# Patient Record
Sex: Female | Born: 2000 | Race: Black or African American | Hispanic: No | Marital: Single | Smoking: Never smoker
Health system: Southern US, Community
[De-identification: ages and names within clinical notes are randomized; demographics above are authoritative.]

## PROBLEM LIST (undated history)

## (undated) DIAGNOSIS — E669 Obesity, unspecified: Secondary | ICD-10-CM

## (undated) DIAGNOSIS — D649 Anemia, unspecified: Secondary | ICD-10-CM

## (undated) DIAGNOSIS — F419 Anxiety disorder, unspecified: Secondary | ICD-10-CM

## (undated) DIAGNOSIS — F32A Depression, unspecified: Secondary | ICD-10-CM

## (undated) DIAGNOSIS — J45909 Unspecified asthma, uncomplicated: Secondary | ICD-10-CM

## (undated) HISTORY — DX: Anxiety disorder, unspecified: F41.9

## (undated) HISTORY — DX: Depression, unspecified: F32.A

## (undated) HISTORY — DX: Obesity, unspecified: E66.9

## (undated) HISTORY — DX: Anemia, unspecified: D64.9

---
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
## 2022-01-04 NOTE — Progress Notes (Signed)
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 Discussed changing to Symbicort and use prior to going to work   Vitamin D deficiency: not currently taking supplements  GAD/MDD: she states she has been depressed since around age 55, during her teen years she had suicidal thoughts and twice she took extra pills trying to commit suicide. She has tried wellbutrin and Celexa but first medication she is not sure why she stopped and celexa made her feel numb but still depressed. She has seen a psychiatrist in the past and has been admitted to a psychiatric facility once. She graduated HS in 2023, currently working part time at [employer]. She has a supportive family . She denies suicidal thoughts or ideation. Phq 9 is very high and is willing to establish with another psychiatrist. We discussed medications since it will take weeks before she can see a psychiatrist. Mother was with her during the visit and will monitor for increase in suicidal thoughts and bring her sooner for a follow up, otherwise one month visit will be scheduled   Obesity: she has been obese all her life. She was diagnosed with pre diabetes an high prolactin in the past. She states since she has a physical job at [employer] she has lost weight . We will check labs. Discussed considering metformin    Patient Active Problem List   Diagnosis Date Noted   Morbid obesity with BMI of 40.0-44.9, adult (HCC) 01/05/2022   Vitamin D deficiency 01/05/2022   Elevated prolactin level 01/05/2022   Asthma, mild intermittent, poorly controlled 01/05/2022   Generalized anxiety disorder 04/16/2019   Moderate episode of recurrent major depressive disorder (HCC) 04/16/2019    Past  Surgical History:  Procedure Laterality Date   TONSILLECTOMY AND ADENOIDECTOMY  2009    Family History  Problem Relation Age of Onset   Hypertension Mother     Social History   Tobacco Use   Smoking status: Never   Smokeless tobacco: Never  Substance Use Topics   Alcohol use: Never     Current Outpatient Medications:    budesonide-formoterol (SYMBICORT) 160-4.5 MCG/ACT inhaler, Inhale 2 puffs into the lungs 2 (two) times daily., Disp: 1 each, Rfl: 2   DULoxetine (CYMBALTA) 30 MG capsule, Take 1-2 capsules (30-60 mg total) by mouth daily., Disp: 60 capsule, Rfl: 0   Vitamin D, Ergocalciferol, (DRISDOL) 1.25 MG (50000 UNIT) CAPS capsule, Take 1 capsule (50,000 Units total) by mouth every 7 (seven) days. Please take every Thursday starting 01/17/2019, Disp: 12 capsule, Rfl: 1  No Known Allergies  I personally reviewed active problem list, medication list, allergies, family history, social history, health maintenance with the patient/caregiver today.   ROS  Constitutional: Negative for fever or weight change.  Respiratory: Negative for cough and shortness of breath.   Cardiovascular: Negative for chest pain or palpitations.  Gastrointestinal: Negative for abdominal pain, no bowel changes.  Musculoskeletal: Negative for gait problem or joint swelling.  Skin: Negative for rash.  Neurological: Negative for dizziness or headache.  No other specific complaints in a complete review of systems (except as listed in HPI above).   Objective  Vitals:   01/05/22 1130  BP: 122/84  Pulse: 90  Resp: 16  SpO2: 97%  Weight: (!) 312 lb (141.5 kg)  Height: 5\' 10"  (1.778 m)    Body mass index is 44.77 kg/m.  Physical Exam  Constitutional: Patient appears well-developed and well-nourished. Obese  No distress.  HEENT: head atraumatic, normocephalic, pupils equal and reactive to light, neck supple, throat within normal limits Cardiovascular: Normal rate, regular rhythm and normal  heart sounds.  No murmur heard. No BLE edema. Pulmonary/Chest: Effort normal and breath sounds normal. No respiratory distress. Abdominal: Soft.  There is no tenderness. Psychiatric: Patient has a normal mood and affect. behavior is normal. Judgment and thought content normal.   PHQ2/9:    01/05/2022   11:30 AM  Depression screen PHQ 2/9  Decreased Interest 3  Down, Depressed, Hopeless 3  PHQ - 2 Score 6  Altered sleeping 3  Tired, decreased energy 1  Change in appetite 3  Feeling bad or failure about yourself  3  Trouble concentrating 3  Moving slowly or fidgety/restless 0  Suicidal thoughts 0  PHQ-9 Score 19    phq 9 is positive   Fall Risk:    01/05/2022   11:29 AM  Fall Risk   Falls in the past year? 0  Number falls in past yr: 0  Injury with Fall? 0  Risk for fall due to : No Fall Risks  Follow up Falls prevention discussed      Functional Status Survey: Is the patient deaf or have difficulty hearing?: No Does the patient have difficulty seeing, even when wearing glasses/contacts?: No Does the patient have difficulty concentrating, remembering, or making decisions?: Yes Does the patient have difficulty walking or climbing stairs?: No Does the patient have difficulty dressing or bathing?: No Does the patient have difficulty doing errands alone such as visiting a doctor's office or shopping?: No    Assessment & Plan   1. Encounter to establish care  - CBC with Differential/Platelet - Lipid Profile - COMPLETE METABOLIC PANEL WITH GFR - [facility] 01/07/2022  2. Moderate episode of recurrent major depressive disorder (HCC)  - Ambulatory referral to Psychiatry - DULoxetine (CYMBALTA) 30 MG capsule; Take 1-2 capsules (30-60 mg total) by mouth daily.  Dispense: 60 capsule; Refill: 0  3. Screen for STD (sexually transmitted disease)  - HIV antibody (with reflex) - Cervicovaginal ancillary only - RPR  4. Morbid obesity with BMI of 40.0-44.9, adult (HCC)  -  TSH  5. Need for hepatitis C screening test  - Hepatitis C Antibody  6. Need for immunization against influenza  - Flu Vaccine QUAD 6+ mos PF IM (Fluarix Quad PF)  7. Vitamin D deficiency  - Vitamin D, Ergocalciferol, (DRISDOL) 1.25 MG (50000 UNIT) CAPS capsule; Take 1 capsule (50,000 Units total) by mouth every 7 (seven) days. Please take every Thursday starting 01/17/2019  Dispense: 12 capsule; Refill: 1  8. Elevated prolactin level  - Prolactin

## 2022-01-05 ENCOUNTER — Other Ambulatory Visit (HOSPITAL_COMMUNITY)
Admission: RE | Admit: 2022-01-05 | Discharge: 2022-01-05 | Disposition: A | Discharge status: Home / Self Care | Payer: BC Managed Care – PPO | Source: Ambulatory Visit | Attending: Family Medicine | Admitting: Family Medicine

## 2022-01-05 ENCOUNTER — Ambulatory Visit: Payer: BC Managed Care – PPO | Admitting: Family Medicine

## 2022-01-05 ENCOUNTER — Encounter: Payer: Self-pay | Admitting: Family Medicine

## 2022-01-05 VITALS — BP 122/84 | HR 90 | Resp 16 | Ht 70.0 in | Wt 312.0 lb

## 2022-01-05 DIAGNOSIS — F331 Major depressive disorder, recurrent, moderate: Secondary | ICD-10-CM | POA: Diagnosis not present

## 2022-01-05 DIAGNOSIS — Z7689 Persons encountering health services in other specified circumstances: Secondary | ICD-10-CM

## 2022-01-05 DIAGNOSIS — E559 Vitamin D deficiency, unspecified: Secondary | ICD-10-CM

## 2022-01-05 DIAGNOSIS — Z113 Encounter for screening for infections with a predominantly sexual mode of transmission: Secondary | ICD-10-CM | POA: Diagnosis present

## 2022-01-05 DIAGNOSIS — Z23 Encounter for immunization: Secondary | ICD-10-CM

## 2022-01-05 DIAGNOSIS — R7989 Other specified abnormal findings of blood chemistry: Secondary | ICD-10-CM | POA: Insufficient documentation

## 2022-01-05 DIAGNOSIS — Z1159 Encounter for screening for other viral diseases: Secondary | ICD-10-CM

## 2022-01-05 DIAGNOSIS — J4520 Mild intermittent asthma, uncomplicated: Secondary | ICD-10-CM

## 2022-01-05 DIAGNOSIS — Z6841 Body Mass Index (BMI) 40.0 and over, adult: Secondary | ICD-10-CM

## 2022-01-05 MED ORDER — VITAMIN D (ERGOCALCIFEROL) 1.25 MG (50000 UNIT) PO CAPS: 50000.0000 [IU] | ORAL_CAPSULE | ORAL | 1 refills | Status: DC

## 2022-01-05 MED ORDER — BUDESONIDE-FORMOTEROL FUMARATE 160-4.5 MCG/ACT IN AERO: 2.0000 | INHALATION_SPRAY | Freq: Two times a day (BID) | RESPIRATORY_TRACT | 2 refills | Status: DC

## 2022-01-05 MED ORDER — DULOXETINE HCL 30 MG PO CPEP: 30.0000 mg | ORAL_CAPSULE | Freq: Every day | ORAL | 0 refills | Status: DC

## 2022-01-06 ENCOUNTER — Encounter: Payer: Self-pay | Admitting: Family Medicine

## 2022-01-07 LAB — CERVICOVAGINAL ANCILLARY ONLY
Bacterial Vaginitis (gardnerella): NEGATIVE
Candida Glabrata: NEGATIVE
Candida Vaginitis: NEGATIVE
Chlamydia: NEGATIVE
Comment: NEGATIVE
Comment: NEGATIVE
Comment: NEGATIVE
Comment: NEGATIVE
Comment: NEGATIVE
Comment: NORMAL
Neisseria Gonorrhea: NEGATIVE
Trichomonas: NEGATIVE

## 2022-01-10 ENCOUNTER — Other Ambulatory Visit: Payer: Self-pay

## 2022-01-10 DIAGNOSIS — D649 Anemia, unspecified: Secondary | ICD-10-CM

## 2022-01-12 LAB — LIPID PANEL
Cholesterol: 153 mg/dL (ref <170)
HDL: 58 mg/dL (ref >45)
LDL Cholesterol (Calc): 80 mg/dL (calc) (ref <110)
Non-HDL Cholesterol (Calc): 95 mg/dL (calc) (ref <120)
Total CHOL/HDL Ratio: 2.6 (calc) (ref <5.0)
Triglycerides: 66 mg/dL (ref <90)

## 2022-01-12 LAB — COMPLETE METABOLIC PANEL WITH GFR
AG Ratio: 1.4 (calc) (ref 1.0–2.5)
ALT: 8 U/L (ref 5–32)
AST: 14 U/L (ref 12–32)
Albumin: 4.2 g/dL (ref 3.6–5.1)
Alkaline phosphatase (APISO): 96 U/L (ref 36–128)
BUN: 10 mg/dL (ref 7–20)
CO2: 24 mmol/L (ref 20–32)
Calcium: 9.7 mg/dL (ref 8.9–10.4)
Chloride: 105 mmol/L (ref 98–110)
Creat: 0.83 mg/dL (ref 0.50–0.96)
Globulin: 3 g/dL (calc) (ref 2.0–3.8)
Glucose, Bld: 85 mg/dL (ref 65–99)
Potassium: 4.4 mmol/L (ref 3.8–5.1)
Sodium: 139 mmol/L (ref 135–146)
Total Bilirubin: 0.7 mg/dL (ref 0.2–1.1)
Total Protein: 7.2 g/dL (ref 6.3–8.2)
eGFR: 105 mL/min/{1.73_m2} (ref >=60)

## 2022-01-12 LAB — HIV ANTIBODY (ROUTINE TESTING W REFLEX): HIV 1&2 Ab, 4th Generation: NONREACTIVE

## 2022-01-12 LAB — CBC WITH DIFFERENTIAL/PLATELET
Absolute Monocytes: 551 cells/uL (ref 200–900)
Basophils Absolute: 22 cells/uL (ref 0–200)
Basophils Relative: 0.4 %
Eosinophils Absolute: 151 cells/uL (ref 15–500)
Eosinophils Relative: 2.8 %
HCT: 35.5 % (ref 34.0–46.0)
Hemoglobin: 10.6 g/dL — ABNORMAL LOW (ref 11.5–15.3)
Lymphs Abs: 1760 cells/uL (ref 1200–5200)
MCH: 21.6 pg — ABNORMAL LOW (ref 25.0–35.0)
MCHC: 29.9 g/dL — ABNORMAL LOW (ref 31.0–36.0)
MCV: 72.3 fL — ABNORMAL LOW (ref 78.0–98.0)
MPV: 11.6 fL (ref 7.5–12.5)
Monocytes Relative: 10.2 %
Neutro Abs: 2916 cells/uL (ref 1800–8000)
Neutrophils Relative %: 54 %
Platelets: 364 10*3/uL (ref 140–400)
RBC: 4.91 10*6/uL (ref 3.80–5.10)
RDW: 15.8 % — ABNORMAL HIGH (ref 11.0–15.0)
Total Lymphocyte: 32.6 %
WBC: 5.4 10*3/uL (ref 4.5–13.0)

## 2022-01-12 LAB — TEST AUTHORIZATION

## 2022-01-12 LAB — PROLACTIN: Prolactin: 11.1 ng/mL

## 2022-01-12 LAB — HEMOGLOBIN A1C
Hgb A1c MFr Bld: 5.8 % of total Hgb — ABNORMAL HIGH (ref <5.7)
Mean Plasma Glucose: 120 mg/dL
eAG (mmol/L): 6.6 mmol/L

## 2022-01-12 LAB — IRON,TIBC AND FERRITIN PANEL
%SAT: 12 % (calc) — ABNORMAL LOW (ref 15–45)
Ferritin: 7 ng/mL (ref 6–67)
Iron: 51 ug/dL (ref 27–164)
TIBC: 417 mcg/dL (calc) (ref 271–448)

## 2022-01-12 LAB — HEPATITIS C ANTIBODY: Hepatitis C Ab: NONREACTIVE

## 2022-01-12 LAB — RPR: RPR Ser Ql: NONREACTIVE

## 2022-01-12 LAB — TSH: TSH: 0.73 mIU/L

## 2022-01-14 ENCOUNTER — Other Ambulatory Visit: Payer: Self-pay

## 2022-01-14 DIAGNOSIS — D649 Anemia, unspecified: Secondary | ICD-10-CM

## 2022-01-27 ENCOUNTER — Other Ambulatory Visit: Payer: Self-pay | Admitting: Family Medicine

## 2022-01-27 DIAGNOSIS — F331 Major depressive disorder, recurrent, moderate: Secondary | ICD-10-CM

## 2022-01-28 ENCOUNTER — Other Ambulatory Visit: Payer: Self-pay | Admitting: Family Medicine

## 2022-01-28 MED ORDER — DULOXETINE HCL 60 MG PO CPEP: 60.0000 mg | ORAL_CAPSULE | Freq: Every day | ORAL | 0 refills | Status: DC

## 2022-02-01 NOTE — Progress Notes (Unsigned)
Name: Michelle Richardson   MRN: [id number]    DOB: Mar 15, 2001   Date:02/02/2022       Progress Note  Subjective  Chief Complaint  Follow Up  HPI   GAD/MDD: she states she has been depressed since around age 21, during her teen years she had suicidal thoughts and twice she took extra pills trying to commit suicide. She has tried wellbutrin and Celexa but first medication she is not sure why she stopped and celexa made her feel numb but still depressed. She has seen a psychiatrist in the past and has been admitted to a psychiatric facility once. She graduated HS in 2023, currently working part time at [employer]. She has a supportive family  She has noticed some suicidal thoughts - no planning - she states that she would not do it , does not want to upset her family. She states Duloxetine has helped her feel her emotions before medication she felt like she was just existing and nothing mattered. We discussed importance of talking to her mother and also seeing a therapist. She states she grew up as the middle child and has been known for being independent. Explained to her that it is okay to ask for help and rely on family members to care for her  Iron deficiency anemia: she has been taking otc iron supplements about 5 times a week, we will recheck next visit   Patient Active Problem List   Diagnosis Date Noted   Morbid obesity with BMI of 40.0-44.9, adult ([place]) 01/05/2022   Vitamin D deficiency 01/05/2022   Asthma, mild intermittent, poorly controlled 01/05/2022   Generalized anxiety disorder 04/16/2019   Moderate episode of recurrent major depressive disorder ([place]) 04/16/2019    Past Surgical History:  Procedure Laterality Date   TONSILLECTOMY AND ADENOIDECTOMY  08/02/2008    Family History  Problem Relation Age of Onset   Hypertension Mother     Social History   Tobacco Use   Smoking status: Never   Smokeless tobacco: Never  Substance Use Topics   Alcohol use: Never     Current  Outpatient Medications:    budesonide-formoterol (SYMBICORT) 160-4.5 MCG/ACT inhaler, Inhale 2 puffs into the lungs 2 (two) times daily., Disp: 1 each, Rfl: 2   DULoxetine (CYMBALTA) 60 MG capsule, Take 1 capsule (60 mg total) by mouth daily., Disp: 30 capsule, Rfl: 0   Vitamin D, Ergocalciferol, (DRISDOL) 1.25 MG (50000 UNIT) CAPS capsule, Take 1 capsule (50,000 Units total) by mouth every 7 (seven) days. Please take every Thursday starting 01/17/2019, Disp: 12 capsule, Rfl: 1  No Known Allergies  I personally reviewed active problem list, medication list, allergies, family history, social history, health maintenance with the patient/caregiver today.   ROS  Ten systems reviewed and is negative except as mentioned in HPI   Objective  Vitals:   02/02/22 1128  BP: 124/80  Pulse: 99  Resp: 16  Temp: 98.5 F (36.9 C)  TempSrc: Oral  SpO2: 100%  Weight: (!) 316 lb 14.4 oz (143.7 kg)  Height: 5' 10"  (1.778 m)    Body mass index is 45.47 kg/m.  Physical Exam  Constitutional: Patient appears well-developed and well-nourished. Obese  No distress.  HEENT: head atraumatic, normocephalic, pupils equal and reactive to light, neck supple Cardiovascular: Normal rate, regular rhythm and normal heart sounds.  No murmur heard. No BLE edema. Pulmonary/Chest: Effort normal and breath sounds normal. No respiratory distress. Abdominal: Soft.  There is no tenderness. Psychiatric: Patient has a normal mood  and affect. behavior is normal. Judgment and thought content normal.    Recent Results (from the past 2160 hour(s))  Cervicovaginal ancillary only     Status: None   Collection Time: 01/05/22 12:08 PM  Result Value Ref Range   Neisseria Gonorrhea Negative    Chlamydia Negative    Trichomonas Negative    Bacterial Vaginitis (gardnerella) Negative    Candida Vaginitis Negative    Candida Glabrata Negative    Comment      Normal Reference Range Bacterial Vaginosis - Negative   Comment  Normal Reference Range Candida Species - Negative    Comment Normal Reference Range Candida Galbrata - Negative    Comment Normal Reference Range Trichomonas - Negative    Comment Normal Reference Ranger Chlamydia - Negative    Comment      Normal Reference Range Neisseria Gonorrhea - Negative  HIV antibody (with reflex)     Status: None   Collection Time: 01/06/22 10:45 AM  Result Value Ref Range   HIV 1&2 Ab, 4th Generation NON-REACTIVE NON-REACTIVE    Comment: HIV-1 antigen and HIV-1/HIV-2 antibodies were not detected. There is no laboratory evidence of HIV infection. Marland Kitchen PLEASE NOTE: This information has been disclosed to you from records whose confidentiality may be protected by state law.  If your state requires such protection, then the state law prohibits you from making any further disclosure of the information without the specific written consent of the person to whom it pertains, or as otherwise permitted by law. A general authorization for the release of medical or other information is NOT sufficient for this purpose. . For additional information please refer to http://education.[facility].com/faq/FAQ106 (This link is being provided for informational/ educational purposes only.) . Marland Kitchen The performance of this assay has not been clinically validated in patients less than 48 years old. .   Hepatitis C Antibody     Status: None   Collection Time: 01/06/22 10:45 AM  Result Value Ref Range   Hepatitis C Ab NON-REACTIVE NON-REACTIVE    Comment: . HCV antibody was non-reactive. There is no laboratory  evidence of HCV infection. . In most cases, no further action is required. However, if recent HCV exposure is suspected, a test for HCV RNA (test code [id number]) is suggested. . For additional information please refer to http://education.[facility].com/faq/FAQ22v1 (This link is being provided for informational/ educational purposes only.) .   CBC with  Differential/Platelet     Status: Abnormal   Collection Time: 01/06/22 10:45 AM  Result Value Ref Range   WBC 5.4 4.5 - 13.0 Thousand/uL   RBC 4.91 3.80 - 5.10 Million/uL   Hemoglobin 10.6 (L) 11.5 - 15.3 g/dL   HCT 35.5 34.0 - 46.0 %   MCV 72.3 (L) 78.0 - 98.0 fL   MCH 21.6 (L) 25.0 - 35.0 pg   MCHC 29.9 (L) 31.0 - 36.0 g/dL   RDW 15.8 (H) 11.0 - 15.0 %   Platelets 364 140 - 400 Thousand/uL   MPV 11.6 7.5 - 12.5 fL   Neutro Abs 2,916 1,800 - 8,000 cells/uL   Lymphs Abs 1,760 1,200 - 5,200 cells/uL   Absolute Monocytes 551 200 - 900 cells/uL   Eosinophils Absolute 151 15 - 500 cells/uL   Basophils Absolute 22 0 - 200 cells/uL   Neutrophils Relative % 54 %   Total Lymphocyte 32.6 %   Monocytes Relative 10.2 %   Eosinophils Relative 2.8 %   Basophils Relative 0.4 %  Lipid Profile  Status: None   Collection Time: 01/06/22 10:45 AM  Result Value Ref Range   Cholesterol 153 <170 mg/dL   HDL 58 >45 mg/dL   Triglycerides 66 <90 mg/dL   LDL Cholesterol (Calc) 80 <110 mg/dL (calc)    Comment: LDL-C is now calculated using the Martin-Hopkins  calculation, which is a validated novel method providing  better accuracy than the Friedewald equation in the  estimation of LDL-C.  Cresenciano Genre et al. Annamaria Helling. 1607;371(06): 2061-2068  (http://education.[facility].com/faq/FAQ164)    Total CHOL/HDL Ratio 2.6 <5.0 (calc)   Non-HDL Cholesterol (Calc) 95 <120 mg/dL (calc)    Comment: For patients with diabetes plus 1 major ASCVD risk  factor, treating to a non-HDL-C goal of <100 mg/dL  (LDL-C of <70 mg/dL) is considered a therapeutic  option.   COMPLETE METABOLIC PANEL WITH GFR     Status: None   Collection Time: 01/06/22 10:45 AM  Result Value Ref Range   Glucose, Bld 85 65 - 99 mg/dL    Comment: .            Fasting reference interval .    BUN 10 7 - 20 mg/dL   Creat 0.83 0.50 - 0.96 mg/dL   eGFR 105 > OR = 60 mL/min/1.15m   BUN/Creatinine Ratio SEE NOTE: 6 - 22 (calc)     Comment:    Not Reported: BUN and Creatinine are within    reference range. .    Sodium 139 135 - 146 mmol/L   Potassium 4.4 3.8 - 5.1 mmol/L   Chloride 105 98 - 110 mmol/L   CO2 24 20 - 32 mmol/L   Calcium 9.7 8.9 - 10.4 mg/dL   Total Protein 7.2 6.3 - 8.2 g/dL   Albumin 4.2 3.6 - 5.1 g/dL   Globulin 3.0 2.0 - 3.8 g/dL (calc)   AG Ratio 1.4 1.0 - 2.5 (calc)   Total Bilirubin 0.7 0.2 - 1.1 mg/dL   Alkaline phosphatase (APISO) 96 36 - 128 U/L   AST 14 12 - 32 U/L   ALT 8 5 - 32 U/L  HgB A1c     Status: Abnormal   Collection Time: 01/06/22 10:45 AM  Result Value Ref Range   Hgb A1c MFr Bld 5.8 (H) <5.7 % of total Hgb    Comment: For someone without known diabetes, a hemoglobin  A1c value between 5.7% and 6.4% is consistent with prediabetes and should be confirmed with a  follow-up test. . For someone with known diabetes, a value <7% indicates that their diabetes is well controlled. A1c targets should be individualized based on duration of diabetes, age, comorbid conditions, and other considerations. . This assay result is consistent with an increased risk of diabetes. . Currently, no consensus exists regarding use of hemoglobin A1c for diagnosis of diabetes for children. .    Mean Plasma Glucose 120 mg/dL   eAG (mmol/L) 6.6 mmol/L  RPR     Status: None   Collection Time: 01/06/22 10:45 AM  Result Value Ref Range   RPR Ser Ql NON-REACTIVE NON-REACTIVE  TSH     Status: None   Collection Time: 01/06/22 10:45 AM  Result Value Ref Range   TSH 0.73 mIU/L    Comment:            Reference Range .            1-19 Years 0.50-4.30 .                Pregnancy Ranges  First trimester   0.26-2.66            Second trimester  0.55-2.73            Third trimester   0.43-2.91   Prolactin     Status: None   Collection Time: 01/06/22 10:45 AM  Result Value Ref Range   Prolactin 11.1 ng/mL    Comment:            Stages of Puberty ([facility] Stages) .                        Female Observed     Female Observed                       Range (ng/mL)       Range (ng/mL) Stage I:              3.6 - 12.0          < OR = 10.0 Stage II - III:       2.6 - 18.0          < OR = 6.1 Stage IV - V:         3.2 - 20.0          2.8 - 11.0 . .   Iron, TIBC and Ferritin Panel     Status: Abnormal   Collection Time: 01/06/22 10:45 AM  Result Value Ref Range   Iron 51 27 - 164 mcg/dL   TIBC 417 271 - 448 mcg/dL (calc)   %SAT 12 (L) 15 - 45 % (calc)   Ferritin 7 6 - 67 ng/mL  TEST AUTHORIZATION     Status: None   Collection Time: 01/06/22 10:45 AM  Result Value Ref Range   TEST NAME: IRON, TIBC AND FERRITIN PANEL    TEST CODE: [id number]    CLIENT CONTACT: LESLY SMITH    REPORT ALWAYS MESSAGE SIGNATURE      Comment: . The laboratory testing on this patient was verbally requested or confirmed by the ordering physician or his or her authorized representative after contact with an employee of [employer]. Federal regulations require that we maintain on file written authorization for all laboratory testing.  Accordingly we are asking that the ordering physician or his or her authorized representative sign a copy of this report and promptly return it to the client service representative. . . Signature:____________________________________________________ . Please fax this signed page to [phone] or return it via your [employer] courier.     PHQ2/9:    02/02/2022   11:29 AM 01/05/2022   11:30 AM  Depression screen PHQ 2/9  Decreased Interest 3 3  Down, Depressed, Hopeless 3 3  PHQ - 2 Score 6 6  Altered sleeping 3 3  Tired, decreased energy 2 1  Change in appetite 3 3  Feeling bad or failure about yourself  3 3  Trouble concentrating 3 3  Moving slowly or fidgety/restless 2 0  Suicidal thoughts 1 0  PHQ-9 Score 23 19  Difficult doing work/chores Somewhat difficult     phq 9 is positive   Fall Risk:    02/02/2022   11:23 AM 01/05/2022    11:29 AM  Fall Risk   Falls in the past year? 0 0  Number falls in past yr:  0  Injury with Fall?  0  Risk for fall due to : No Fall Risks No  Fall Risks  Follow up Falls prevention discussed;Education provided;Falls evaluation completed Falls prevention discussed     Functional Status Survey: Is the patient deaf or have difficulty hearing?: No Does the patient have difficulty seeing, even when wearing glasses/contacts?: No Does the patient have difficulty concentrating, remembering, or making decisions?: Yes Does the patient have difficulty walking or climbing stairs?: No Does the patient have difficulty dressing or bathing?: No Does the patient have difficulty doing errands alone such as visiting a doctor's office or shopping?: No    Assessment & Plan  1. Moderate episode of recurrent major depressive disorder (HCC)  - DULoxetine (CYMBALTA) 60 MG capsule; Take 1 capsule (60 mg total) by mouth daily.  Dispense: 30 capsule; Refill: 0    2. Iron deficiency anemia due to chronic blood loss

## 2022-02-02 ENCOUNTER — Encounter: Payer: Self-pay | Admitting: Family Medicine

## 2022-02-02 ENCOUNTER — Ambulatory Visit: Payer: BC Managed Care – PPO | Admitting: Family Medicine

## 2022-02-02 VITALS — BP 124/80 | HR 99 | Temp 98.5°F | Resp 16 | Ht 70.0 in | Wt 316.9 lb

## 2022-02-02 DIAGNOSIS — D500 Iron deficiency anemia secondary to blood loss (chronic): Secondary | ICD-10-CM

## 2022-02-02 DIAGNOSIS — F331 Major depressive disorder, recurrent, moderate: Secondary | ICD-10-CM

## 2022-02-02 MED ORDER — DULOXETINE HCL 60 MG PO CPEP: 60.0000 mg | ORAL_CAPSULE | Freq: Every day | ORAL | 0 refills | Status: DC

## 2022-02-02 NOTE — Patient Instructions (Addendum)
Tc on 01-25-22 @ 10:00 pt was given appt for 03-30-22  Pt was told to arrive at 1:30 for a 2:00 appt. New pt paperwork mailed and emailed.   Dr. Shea Evans is the psychiatrist   Therapists in town:   [address]  [place]  or [facility] [place]  [facility] - [place]

## 2022-02-07 NOTE — Progress Notes (Unsigned)
Name: Michelle Richardson   MRN: [id number]    DOB: [birth year]   Date:02/08/2022       Progress Note  Subjective  Chief Complaint  Annual Exam   HPI  Patient presents for annual CPE.  Diet: she skips meals at times, she likes smoothies, also likes Orange juice, powerade Exercise: continue regular physical activity   Last Eye Exam: up to date  Last Dental Exam: twice a year   [place] Visit from 01/05/2022 in [facility]  AUDIT-C Score 0      Depression: Phq 9 is  positive - she is taking duloxetine - waiting to see psychiatrist     02/08/2022   11:50 AM 02/02/2022   11:29 AM 01/05/2022   11:30 AM  Depression screen PHQ 2/9  Decreased Interest _0 Down, Depressed, Hopeless _1 PHQ - 2 Score _2 Altered sleeping _3 Tired, decreased energy _4 Change in appetite _5 Feeling bad or failure about yourself  _6 Trouble concentrating _7 Moving slowly or fidgety/restless 1 2 0  Suicidal thoughts 0 1 0  PHQ-9 Score _8 Difficult doing work/chores Somewhat difficult Somewhat difficult    Hypertension: BP Readings from Last 3 Encounters:  02/08/22 122/78  02/02/22 124/80  01/05/22 122/84   Obesity: Wt Readings from Last 3 Encounters:  02/08/22 (!) 314 lb 14.4 oz (142.8 kg) (>99 %, Z= 2.85)*  02/02/22 (!) 316 lb 14.4 oz (143.7 kg) (>99 %, Z= 2.85)*  01/05/22 (!) 312 lb (141.5 kg) (>99 %, Z= 2.82)*   * Growth percentiles are based on CDC (Girls, 2-20 Years) data.   BMI Readings from Last 3 Encounters:  02/08/22 45.18 kg/m (>99 %, Z= 2.76)*  02/02/22 45.47 kg/m (>99 %, Z= 2.79)*  01/05/22 44.77 kg/m (>99 %, Z= 2.73)*   * Growth percentiles are based on CDC (Girls, 2-20 Years) data.     Vaccines:   HPV: up to date Tdap: up to date Shingrix: N/A Pneumonia: N/A Flu: up to date COVID-52: up to date   Hep C Screening: 01/06/2022 STD testing and prevention (HIV/chl/gon/syphilis): 01/06/2022 Intimate  partner violence: negative screen  Sexual History :never  Menstrual History/LMP/Abnormal Bleeding: regular cycles , heavy , discussed iron supplementation  Discussed importance of follow up if any post-menopausal bleeding: not applicable  Incontinence Symptoms: negative for symptoms   Breast cancer:  - Last Mammogram: N/A - BRCA gene screening:N/A   Osteoporosis Prevention : Discussed high calcium and vitamin D supplementation, weight bearing exercises Bone density: N/A   Cervical cancer screening: N/A  Skin cancer: Discussed monitoring for atypical lesions   Advanced Care Planning: A voluntary discussion about advance care planning including the explanation and discussion of advance directives.  Discussed health care proxy and Living will, and the patient was able to identify a health care proxy as mother .  Patient does not have a living will and power of attorney of health care   Lipids: Lab Results  Component Value Date   CHOL 153 01/06/2022   CHOL 150 01/11/2019   Lab Results  Component Value Date   HDL 58 01/06/2022   HDL 47 01/11/2019   Lab Results  Component Value Date   LDLCALC 80 01/06/2022   [place] 91 01/11/2019   Lab Results  Component Value Date   TRIG 66 01/06/2022   TRIG 59 01/11/2019  Lab Results  Component Value Date   CHOLHDL 2.6 01/06/2022   CHOLHDL 3.2 01/11/2019   No results found for: "LDLDIRECT"  Glucose: Glucose, Bld  Date Value Ref Range Status  01/06/2022 85 65 - 99 mg/dL Final    Comment:    .            Fasting reference interval .   01/11/2019 93 70 - 99 mg/dL Final  01/09/2019 108 (H) 70 - 99 mg/dL Final    Patient Active Problem List   Diagnosis Date Noted   Morbid obesity with BMI of 40.0-44.9, adult ([place]) 01/05/2022   Vitamin D deficiency 01/05/2022   Asthma, mild intermittent, poorly controlled 01/05/2022   Generalized anxiety disorder 04/16/2019   Moderate episode of recurrent major depressive disorder ([place])  04/16/2019    Past Surgical History:  Procedure Laterality Date   TONSILLECTOMY AND ADENOIDECTOMY  08/02/2008    Family History  Problem Relation Age of Onset   Hypertension Mother     Social History   Socioeconomic History   Marital status: Single    Spouse name: Not on file   Number of children: Not on file   Years of education: Not on file   Highest education level: Not on file  Occupational History   Not on file  Tobacco Use   Smoking status: Never   Smokeless tobacco: Never  Vaping Use   Vaping Use: Never used  Substance and Sexual Activity   Alcohol use: Never   Drug use: Never   Sexual activity: Not Currently    Birth control/protection: Abstinence  Other Topics Concern   Not on file  Social History Narrative   Not on file   Social Determinants of Health   Financial Resource Strain: Low Risk  (01/05/2022)   Overall Financial Resource Strain (CARDIA)    Difficulty of Paying Living Expenses: Not hard at all  Food Insecurity: No Food Insecurity (02/08/2022)   Hunger Vital Sign    Worried About Running Out of Food in the Last Year: Never true    Ran Out of Food in the Last Year: Never true  Transportation Needs: No Transportation Needs (02/08/2022)   PRAPARE - Hydrologist (Medical): No    Lack of Transportation (Non-Medical): No  Physical Activity: Sufficiently Active (02/08/2022)   Exercise Vital Sign    Days of Exercise per Week: 5 days    Minutes of Exercise per Session: 80 min  Stress: Stress Concern Present (02/08/2022)   [place]    Feeling of Stress : Very much  Social Connections: Moderately Integrated (02/08/2022)   Social Connection and Isolation Panel [NHANES]    Frequency of Communication with Friends and Family: More than three times a week    Frequency of Social Gatherings with Friends and Family: Twice a week    Attends Religious Services:  More than 4 times per year    Active Member of [employer] or Organizations: Yes    Attends Archivist Meetings: Never    Marital Status: Never married  Intimate Partner Violence: Not At Risk (02/08/2022)   Humiliation, Afraid, Rape, and Kick questionnaire    Fear of Current or Ex-Partner: No    Emotionally Abused: No    Physically Abused: No    Sexually Abused: No     Current Outpatient Medications:    budesonide-formoterol (SYMBICORT) 160-4.5 MCG/ACT inhaler, Inhale 2 puffs into the lungs 2 (two) times  daily., Disp: 1 each, Rfl: 2   DULoxetine (CYMBALTA) 60 MG capsule, Take 1 capsule (60 mg total) by mouth daily., Disp: 30 capsule, Rfl: 0   Vitamin D, Ergocalciferol, (DRISDOL) 1.25 MG (50000 UNIT) CAPS capsule, Take 1 capsule (50,000 Units total) by mouth every 7 (seven) days. Please take every Thursday starting 01/17/2019, Disp: 12 capsule, Rfl: 1  No Known Allergies   ROS  Constitutional: Negative for fever or weight change.  Respiratory: Negative for cough and shortness of breath.   Cardiovascular: Negative for chest pain or palpitations.  Gastrointestinal: Negative for abdominal pain, no bowel changes.  Musculoskeletal: Negative for gait problem or joint swelling.  Skin: Negative for rash.  Neurological: Negative for dizziness or headache.  No other specific complaints in a complete review of systems (except as listed in HPI above).   Objective  Vitals:   02/08/22 1133  BP: 122/78  Pulse: 80  Resp: 16  Temp: 98.1 F (36.7 C)  TempSrc: Oral  SpO2: 99%  Weight: (!) 314 lb 14.4 oz (142.8 kg)  Height: _0  (1.778 m)    Body mass index is 45.18 kg/m.  Physical Exam  Constitutional: Patient appears well-developed and well-nourished. Obesity . No distress.  HENT: Head: Normocephalic and atraumatic. Ears: B TMs ok, no erythema or effusion; Nose: Nose normal. Mouth/Throat: Oropharynx is clear and moist. No oropharyngeal exudate.  Eyes: Conjunctivae and EOM are  normal. Pupils are equal, round, and reactive to light. No scleral icterus.  Neck: Normal range of motion. Neck supple. No JVD present. No thyromegaly present.  Cardiovascular: Normal rate, regular rhythm and normal heart sounds.  No murmur heard. No BLE edema. Pulmonary/Chest: Effort normal and breath sounds normal. No respiratory distress. Abdominal: Soft. Bowel sounds are normal, no distension. There is no tenderness. no masses Breast: no lumps or masses, no nipple discharge or rashes FEMALE GENITALIA:  Not done  RECTAL:not done  Musculoskeletal: Normal range of motion, no joint effusions. No gross deformities Neurological: he is alert and oriented to person, place, and time. No cranial nerve deficit. Coordination, balance, strength, speech and gait are normal.  Skin: Skin is warm and dry. No rash noted. No erythema.  Psychiatric: Patient has a normal mood and affect. behavior is normal. Judgment and thought content normal.   Recent Results (from the past 2160 hour(s))  Cervicovaginal ancillary only     Status: None   Collection Time: 01/05/22 12:08 PM  Result Value Ref Range   Neisseria Gonorrhea Negative    Chlamydia Negative    Trichomonas Negative    Bacterial Vaginitis (gardnerella) Negative    Candida Vaginitis Negative    Candida Glabrata Negative    Comment      Normal Reference Range Bacterial Vaginosis - Negative   Comment Normal Reference Range Candida Species - Negative    Comment Normal Reference Range Candida Galbrata - Negative    Comment Normal Reference Range Trichomonas - Negative    Comment Normal Reference Ranger Chlamydia - Negative    Comment      Normal Reference Range Neisseria Gonorrhea - Negative  HIV antibody (with reflex)     Status: None   Collection Time: 01/06/22 10:45 AM  Result Value Ref Range   HIV 1&2 Ab, 4th Generation NON-REACTIVE NON-REACTIVE    Comment: HIV-1 antigen and HIV-1/HIV-2 antibodies were not detected. There is no laboratory  evidence of HIV infection. Marland Kitchen PLEASE NOTE: This information has been disclosed to you from records whose confidentiality may be protected  by state law.  If your state requires such protection, then the state law prohibits you from making any further disclosure of the information without the specific written consent of the person to whom it pertains, or as otherwise permitted by law. A general authorization for the release of medical or other information is NOT sufficient for this purpose. . For additional information please refer to http://education.[facility].com/faq/FAQ106 (This link is being provided for informational/ educational purposes only.) . Marland Kitchen The performance of this assay has not been clinically validated in patients less than 75 years old. .   Hepatitis C Antibody     Status: None   Collection Time: 01/06/22 10:45 AM  Result Value Ref Range   Hepatitis C Ab NON-REACTIVE NON-REACTIVE    Comment: . HCV antibody was non-reactive. There is no laboratory  evidence of HCV infection. . In most cases, no further action is required. However, if recent HCV exposure is suspected, a test for HCV RNA (test code [phone]) is suggested. . For additional information please refer to http://education.[facility].com/faq/FAQ22v1 (This link is being provided for informational/ educational purposes only.) .   CBC with Differential/Platelet     Status: Abnormal   Collection Time: 01/06/22 10:45 AM  Result Value Ref Range   WBC 5.4 4.5 - 13.0 Thousand/uL   RBC 4.91 3.80 - 5.10 Million/uL   Hemoglobin 10.6 (L) 11.5 - 15.3 g/dL   HCT 35.5 34.0 - 46.0 %   MCV 72.3 (L) 78.0 - 98.0 fL   MCH 21.6 (L) 25.0 - 35.0 pg   MCHC 29.9 (L) 31.0 - 36.0 g/dL   RDW 15.8 (H) 11.0 - 15.0 %   Platelets 364 140 - 400 Thousand/uL   MPV 11.6 7.5 - 12.5 fL   Neutro Abs 2,916 1,800 - 8,000 cells/uL   Lymphs Abs 1,760 1,200 - 5,200 cells/uL   Absolute Monocytes 551 200 - 900 cells/uL    Eosinophils Absolute 151 15 - 500 cells/uL   Basophils Absolute 22 0 - 200 cells/uL   Neutrophils Relative % 54 %   Total Lymphocyte 32.6 %   Monocytes Relative 10.2 %   Eosinophils Relative 2.8 %   Basophils Relative 0.4 %  Lipid Profile     Status: None   Collection Time: 01/06/22 10:45 AM  Result Value Ref Range   Cholesterol 153 <170 mg/dL   HDL 58 >45 mg/dL   Triglycerides 66 <90 mg/dL   LDL Cholesterol (Calc) 80 <110 mg/dL (calc)    Comment: LDL-C is now calculated using the Martin-Hopkins  calculation, which is a validated novel method providing  better accuracy than the Friedewald equation in the  estimation of LDL-C.  Cresenciano Genre et al. Annamaria Helling. 8756;433(29): 2061-2068  (http://education.[facility].com/faq/FAQ164)    Total CHOL/HDL Ratio 2.6 <5.0 (calc)   Non-HDL Cholesterol (Calc) 95 <120 mg/dL (calc)    Comment: For patients with diabetes plus 1 major ASCVD risk  factor, treating to a non-HDL-C goal of <100 mg/dL  (LDL-C of <70 mg/dL) is considered a therapeutic  option.   COMPLETE METABOLIC PANEL WITH GFR     Status: None   Collection Time: 01/06/22 10:45 AM  Result Value Ref Range   Glucose, Bld 85 65 - 99 mg/dL    Comment: .            Fasting reference interval .    BUN 10 7 - 20 mg/dL   Creat 0.83 0.50 - 0.96 mg/dL   eGFR 105 > OR = 60 mL/min/1.47m  BUN/Creatinine Ratio SEE NOTE: 6 - 22 (calc)    Comment:    Not Reported: BUN and Creatinine are within    reference range. .    Sodium 139 135 - 146 mmol/L   Potassium 4.4 3.8 - 5.1 mmol/L   Chloride 105 98 - 110 mmol/L   CO2 24 20 - 32 mmol/L   Calcium 9.7 8.9 - 10.4 mg/dL   Total Protein 7.2 6.3 - 8.2 g/dL   Albumin 4.2 3.6 - 5.1 g/dL   Globulin 3.0 2.0 - 3.8 g/dL (calc)   AG Ratio 1.4 1.0 - 2.5 (calc)   Total Bilirubin 0.7 0.2 - 1.1 mg/dL   Alkaline phosphatase (APISO) 96 36 - 128 U/L   AST 14 12 - 32 U/L   ALT 8 5 - 32 U/L  HgB A1c     Status: Abnormal   Collection Time: 01/06/22 10:45 AM   Result Value Ref Range   Hgb A1c MFr Bld 5.8 (H) <5.7 % of total Hgb    Comment: For someone without known diabetes, a hemoglobin  A1c value between 5.7% and 6.4% is consistent with prediabetes and should be confirmed with a  follow-up test. . For someone with known diabetes, a value <7% indicates that their diabetes is well controlled. A1c targets should be individualized based on duration of diabetes, age, comorbid conditions, and other considerations. . This assay result is consistent with an increased risk of diabetes. . Currently, no consensus exists regarding use of hemoglobin A1c for diagnosis of diabetes for children. .    Mean Plasma Glucose 120 mg/dL   eAG (mmol/L) 6.6 mmol/L  RPR     Status: None   Collection Time: 01/06/22 10:45 AM  Result Value Ref Range   RPR Ser Ql NON-REACTIVE NON-REACTIVE  TSH     Status: None   Collection Time: 01/06/22 10:45 AM  Result Value Ref Range   TSH 0.73 mIU/L    Comment:            Reference Range .            1-19 Years 0.50-4.30 .                Pregnancy Ranges            First trimester   0.26-2.66            Second trimester  0.55-2.73            Third trimester   0.43-2.91   Prolactin     Status: None   Collection Time: 01/06/22 10:45 AM  Result Value Ref Range   Prolactin 11.1 ng/mL    Comment:            Stages of Puberty ([place] Stages) .                       Female Observed     Female Observed                       Range (ng/mL)       Range (ng/mL) Stage I:              3.6 - 12.0          < OR = 10.0 Stage II - III:       2.6 - 18.0          < OR = 6.1 Stage IV - V:  3.2 - 20.0          2.8 - 11.0 . .   Iron, TIBC and Ferritin Panel     Status: Abnormal   Collection Time: 01/06/22 10:45 AM  Result Value Ref Range   Iron 51 27 - 164 mcg/dL   TIBC 417 271 - 448 mcg/dL (calc)   %SAT 12 (L) 15 - 45 % (calc)   Ferritin 7 6 - 67 ng/mL  TEST AUTHORIZATION     Status: None   Collection Time: 01/06/22  10:45 AM  Result Value Ref Range   TEST NAME: IRON, TIBC AND FERRITIN PANEL    TEST CODE: [id number]    CLIENT CONTACT: LESLY SMITH    REPORT ALWAYS MESSAGE SIGNATURE      Comment: . The laboratory testing on this patient was verbally requested or confirmed by the ordering physician or his or her authorized representative after contact with an employee of [employer]. Federal regulations require that we maintain on file written authorization for all laboratory testing.  Accordingly we are asking that the ordering physician or his or her authorized representative sign a copy of this report and promptly return it to the client service representative. . . Signature:____________________________________________________ . Please fax this signed page to [phone] or return it via your [employer] courier.      Fall Risk:    02/08/2022   11:31 AM 02/02/2022   11:23 AM 01/05/2022   11:29 AM  Fall Risk   Falls in the past year? 0 0 0  Number falls in past yr:   0  Injury with Fall?   0  Risk for fall due to : No Fall Risks No Fall Risks No Fall Risks  Follow up Falls prevention discussed;Education provided;Falls evaluation completed Falls prevention discussed;Education provided;Falls evaluation completed Falls prevention discussed     Functional Status Survey: Is the patient deaf or have difficulty hearing?: No Does the patient have difficulty seeing, even when wearing glasses/contacts?: No Does the patient have difficulty concentrating, remembering, or making decisions?: Yes Does the patient have difficulty walking or climbing stairs?: Yes Does the patient have difficulty dressing or bathing?: No Does the patient have difficulty doing errands alone such as visiting a doctor's office or shopping?: No   Assessment & Plan  1. Well adult exam    -USPSTF grade A and B recommendations reviewed with patient; age-appropriate recommendations, preventive care, screening  tests, etc discussed and encouraged; healthy living encouraged; see AVS for patient education given to patient -Discussed importance of 150 minutes of physical activity weekly, eat two servings of fish weekly, eat one serving of tree nuts ( cashews, pistachios, pecans, almonds.Marland Kitchen) every other day, eat 6 servings of fruit/vegetables daily and drink plenty of water and avoid sweet beverages.   -Reviewed Health Maintenance: Yes.

## 2022-02-07 NOTE — Patient Instructions (Incomplete)
Preventive Care 50-19 Years Old, Female Preventive care refers to lifestyle choices and visits with your health care provider that can promote health and wellness. At this stage in your life, you may start seeing a primary care physician instead of a pediatrician for your preventive care. Preventive care visits are also called wellness exams. What can I expect for my preventive care visit? Counseling During your preventive care visit, your health care provider may ask about your: Medical history, including: Past medical problems. Family medical history. Pregnancy history. Current health, including: Menstrual cycle. Method of birth control. Emotional well-being. Home life and relationship well-being. Sexual activity and sexual health. Lifestyle, including: Alcohol, nicotine or tobacco, and drug use. Access to firearms. Diet, exercise, and sleep habits. Sunscreen use. Motor vehicle safety. Physical exam Your health care provider may check your: Height and weight. These may be used to calculate your BMI (body mass index). BMI is a measurement that tells if you are at a healthy weight. Waist circumference. This measures the distance around your waistline. This measurement also tells if you are at a healthy weight and may help predict your risk of certain diseases, such as type 2 diabetes and high blood pressure. Heart rate and blood pressure. Body temperature. Skin for abnormal spots. Breasts. What immunizations do I need?  Vaccines are usually given at various ages, according to a schedule. Your health care provider will recommend vaccines for you based on your age, medical history, and lifestyle or other factors, such as travel or where you work. What tests do I need? Screening Your health care provider may recommend screening tests for certain conditions. This may include: Vision and hearing tests. Lipid and cholesterol levels. Pelvic exam and Pap test. Hepatitis B  test. Hepatitis C test. HIV (human immunodeficiency virus) test. STI (sexually transmitted infection) testing, if you are at risk. Tuberculosis skin test if you have symptoms. BRCA-related cancer screening. This may be done if you have a family history of breast, ovarian, tubal, or peritoneal cancers. Talk with your health care provider about your test results, treatment options, and if necessary, the need for more tests. Follow these instructions at home: Eating and drinking  Eat a healthy diet that includes fresh fruits and vegetables, whole grains, lean protein, and low-fat dairy products. Drink enough fluid to keep your urine pale yellow. Do not drink alcohol if: Your health care provider tells you not to drink. You are pregnant, may be pregnant, or are planning to become pregnant. You are under the legal drinking age. In the [place] the legal drinking age is 67. If you drink alcohol: Limit how much you have to 0-1 drink a day. Know how much alcohol is in your drink. In the [place] one drink equals one 12 oz bottle of beer (355 mL), one 5 oz glass of wine (148 mL), or one 1 oz glass of hard liquor (44 mL). Lifestyle Brush your teeth every morning and night with fluoride toothpaste. Floss one time each day. Exercise for at least 30 minutes 5 or more days of the week. Do not use any products that contain nicotine or tobacco. These products include cigarettes, chewing tobacco, and vaping devices, such as e-cigarettes. If you need help quitting, ask your health care provider. Do not use drugs. If you are sexually active, practice safe sex. Use a condom or other form of protection to prevent STIs. If you do not wish to become pregnant, use a form of birth control. If you plan to become pregnant,  see your health care provider for a prepregnancy visit. Find healthy ways to manage stress, such as: Meditation, yoga, or listening to music. Journaling. Talking to a trusted person. Spending time  with friends and family. Safety Always wear your seat belt while driving or riding in a vehicle. Do not drive: If you have been drinking alcohol. Do not ride with someone who has been drinking. When you are tired or distracted. While texting. If you have been using any mind-altering substances or drugs. Wear a helmet and other protective equipment during sports activities. If you have firearms in your house, make sure you follow all gun safety procedures. Seek help if you have been bullied, physically abused, or sexually abused. Use the internet responsibly to avoid dangers, such as online bullying and online sex predators. What's next? Go to your health care provider once a year for an annual wellness visit. Ask your health care provider how often you should have your eyes and teeth checked. Stay up to date on all vaccines. This information is not intended to replace advice given to you by your health care provider. Make sure you discuss any questions you have with your health care provider. Document Revised: 10/07/2020 Document Reviewed: 10/07/2020 Elsevier Patient Education  [place]. Iron-Rich Diet  Iron is a mineral that helps your body produce hemoglobin. Hemoglobin is a protein in red blood cells that carries oxygen to your body's tissues. Eating too little iron may cause you to feel weak and tired, and it can increase your risk of infection. Iron is naturally found in many foods, and many foods have iron added to them (are iron-fortified). You may need to follow an iron-rich diet if you do not have enough iron in your body due to certain medical conditions. The amount of iron that you need each day depends on your age, your sex, and any medical conditions you have. Follow instructions from your health care provider or a dietitian about how much iron you should eat each day. What are tips for following this plan? Reading food labels Check food labels to see how many  milligrams (mg) of iron are in each serving. Cooking Cook foods in pots and pans that are made from iron. Take these steps to make it easier for your body to absorb iron from certain foods: Soak beans overnight before cooking. Soak whole grains overnight and drain them before using. Ferment flours before baking, such as by using yeast in bread dough. Meal planning When you eat foods that contain iron, you should eat them with foods that are high in vitamin C. These include oranges, peppers, tomatoes, potatoes, and mangoes. Vitamin C helps your body absorb iron. Certain foods and drinks prevent your body from absorbing iron properly. Avoid eating these foods in the same meal as iron-rich foods or with iron supplements. These foods include: Coffee, black tea, and red wine. Milk, dairy products, and foods that are high in calcium. Beans and soybeans. Whole grains. General information Take iron supplements only as told by your health care provider. An overdose of iron can be life-threatening. If you were prescribed iron supplements, take them with orange juice or a vitamin C supplement. When you eat iron-fortified foods or take an iron supplement, you should also eat foods that naturally contain iron, such as meat, poultry, and fish. Eating naturally iron-rich foods helps your body absorb the iron that is added to other foods or contained in a supplement. Iron from animal sources is better absorbed  than iron from plant sources. What foods should I eat? Fruits Prunes. Raisins. Eat fruits high in vitamin C, such as oranges, grapefruits, and strawberries, with iron-rich foods. Vegetables Spinach (cooked). Green peas. Broccoli. Fermented vegetables. Eat vegetables high in vitamin C, such as leafy greens, potatoes, bell peppers, and tomatoes, with iron-rich foods. Grains Iron-fortified breakfast cereal. Iron-fortified whole-wheat bread. Enriched rice. Sprouted grains. Meats and other proteins Beef  liver. Beef. [place]. Chicken. Oysters. Shrimp. [place]. Sardines. Chickpeas. Nuts. Tofu. Pumpkin seeds. Beverages Tomato juice. Fresh orange juice. Prune juice. Hibiscus tea. Iron-fortified instant breakfast shakes. Sweets and desserts Blackstrap molasses. Seasonings and condiments Tahini. Fermented soy sauce. Other foods Wheat germ. The items listed above may not be a complete list of recommended foods and beverages. Contact a dietitian for more information. What foods should I limit? These are foods that should be limited while eating iron-rich foods as they can reduce the absorption of iron in your body. Grains Whole grains. Bran cereal. Bran flour. Meats and other proteins Soybeans. Products made from soy protein. Black beans. Lentils. Mung beans. Split peas. Dairy Milk. Cream. Cheese. Yogurt. Cottage cheese. Beverages Coffee. Black tea. Red wine. Sweets and desserts Cocoa. Chocolate. Ice cream. Seasonings and condiments Basil. Oregano. Large amounts of parsley. The items listed above may not be a complete list of foods and beverages you should limit. Contact a dietitian for more information. Summary Iron is a mineral that helps your body produce hemoglobin. Hemoglobin is a protein in red blood cells that carries oxygen to your body's tissues. Iron is naturally found in many foods, and many foods have iron added to them (are iron-fortified). When you eat foods that contain iron, you should eat them with foods that are high in vitamin C. Vitamin C helps your body absorb iron. Certain foods and drinks prevent your body from absorbing iron properly, such as whole grains and dairy products. You should avoid eating these foods in the same meal as iron-rich foods or with iron supplements. This information is not intended to replace advice given to you by your health care provider. Make sure you discuss any questions you have with your health care provider. Document Revised: 03/23/2020  Document Reviewed: 03/23/2020 Elsevier Patient Education  [place]. Iron-Rich Diet  Iron is a mineral that helps your body produce hemoglobin. Hemoglobin is a protein in red blood cells that carries oxygen to your body's tissues. Eating too little iron may cause you to feel weak and tired, and it can increase your risk of infection. Iron is naturally found in many foods, and many foods have iron added to them (are iron-fortified). You may need to follow an iron-rich diet if you do not have enough iron in your body due to certain medical conditions. The amount of iron that you need each day depends on your age, your sex, and any medical conditions you have. Follow instructions from your health care provider or a dietitian about how much iron you should eat each day. What are tips for following this plan? Reading food labels Check food labels to see how many milligrams (mg) of iron are in each serving. Cooking Cook foods in pots and pans that are made from iron. Take these steps to make it easier for your body to absorb iron from certain foods: Soak beans overnight before cooking. Soak whole grains overnight and drain them before using. Ferment flours before baking, such as by using yeast in bread dough. Meal planning When you eat foods that contain  iron, you should eat them with foods that are high in vitamin C. These include oranges, peppers, tomatoes, potatoes, and mangoes. Vitamin C helps your body absorb iron. Certain foods and drinks prevent your body from absorbing iron properly. Avoid eating these foods in the same meal as iron-rich foods or with iron supplements. These foods include: Coffee, black tea, and red wine. Milk, dairy products, and foods that are high in calcium. Beans and soybeans. Whole grains. General information Take iron supplements only as told by your health care provider. An overdose of iron can be life-threatening. If you were prescribed iron supplements,  take them with orange juice or a vitamin C supplement. When you eat iron-fortified foods or take an iron supplement, you should also eat foods that naturally contain iron, such as meat, poultry, and fish. Eating naturally iron-rich foods helps your body absorb the iron that is added to other foods or contained in a supplement. Iron from animal sources is better absorbed than iron from plant sources. What foods should I eat? Fruits Prunes. Raisins. Eat fruits high in vitamin C, such as oranges, grapefruits, and strawberries, with iron-rich foods. Vegetables Spinach (cooked). Green peas. Broccoli. Fermented vegetables. Eat vegetables high in vitamin C, such as leafy greens, potatoes, bell peppers, and tomatoes, with iron-rich foods. Grains Iron-fortified breakfast cereal. Iron-fortified whole-wheat bread. Enriched rice. Sprouted grains. Meats and other proteins Beef liver. Beef. [place]. Chicken. Oysters. Shrimp. [place]. Sardines. Chickpeas. Nuts. Tofu. Pumpkin seeds. Beverages Tomato juice. Fresh orange juice. Prune juice. Hibiscus tea. Iron-fortified instant breakfast shakes. Sweets and desserts Blackstrap molasses. Seasonings and condiments Tahini. Fermented soy sauce. Other foods Wheat germ. The items listed above may not be a complete list of recommended foods and beverages. Contact a dietitian for more information. What foods should I limit? These are foods that should be limited while eating iron-rich foods as they can reduce the absorption of iron in your body. Grains Whole grains. Bran cereal. Bran flour. Meats and other proteins Soybeans. Products made from soy protein. Black beans. Lentils. Mung beans. Split peas. Dairy Milk. Cream. Cheese. Yogurt. Cottage cheese. Beverages Coffee. Black tea. Red wine. Sweets and desserts Cocoa. Chocolate. Ice cream. Seasonings and condiments Basil. Oregano. Large amounts of parsley. The items listed above may not be a complete list of  foods and beverages you should limit. Contact a dietitian for more information. Summary Iron is a mineral that helps your body produce hemoglobin. Hemoglobin is a protein in red blood cells that carries oxygen to your body's tissues. Iron is naturally found in many foods, and many foods have iron added to them (are iron-fortified). When you eat foods that contain iron, you should eat them with foods that are high in vitamin C. Vitamin C helps your body absorb iron. Certain foods and drinks prevent your body from absorbing iron properly, such as whole grains and dairy products. You should avoid eating these foods in the same meal as iron-rich foods or with iron supplements. This information is not intended to replace advice given to you by your health care provider. Make sure you discuss any questions you have with your health care provider. Document Revised: 03/23/2020 Document Reviewed: 03/23/2020 Elsevier Patient Education  [place].

## 2022-02-08 ENCOUNTER — Encounter: Payer: Self-pay | Admitting: Family Medicine

## 2022-02-08 ENCOUNTER — Ambulatory Visit (INDEPENDENT_AMBULATORY_CARE_PROVIDER_SITE_OTHER): Payer: BC Managed Care – PPO | Admitting: Family Medicine

## 2022-02-08 VITALS — BP 122/78 | HR 80 | Temp 98.1°F | Resp 16 | Ht 70.0 in | Wt 314.9 lb

## 2022-02-08 DIAGNOSIS — Z0000 Encounter for general adult medical examination without abnormal findings: Secondary | ICD-10-CM | POA: Diagnosis not present

## 2022-03-22 ENCOUNTER — Other Ambulatory Visit: Payer: Self-pay | Admitting: Family Medicine

## 2022-03-22 DIAGNOSIS — F331 Major depressive disorder, recurrent, moderate: Secondary | ICD-10-CM

## 2022-03-30 ENCOUNTER — Encounter: Payer: Self-pay | Admitting: Psychiatry

## 2022-03-30 ENCOUNTER — Ambulatory Visit: Payer: BC Managed Care – PPO | Admitting: Psychiatry

## 2022-03-30 VITALS — BP 133/88 | HR 91 | Temp 98.3°F | Ht 70.0 in | Wt 316.4 lb

## 2022-03-30 DIAGNOSIS — R41840 Attention and concentration deficit: Secondary | ICD-10-CM

## 2022-03-30 DIAGNOSIS — F331 Major depressive disorder, recurrent, moderate: Secondary | ICD-10-CM

## 2022-03-30 DIAGNOSIS — F411 Generalized anxiety disorder: Secondary | ICD-10-CM

## 2022-03-30 MED ORDER — DULOXETINE HCL 20 MG PO CPEP: 20.0000 mg | ORAL_CAPSULE | Freq: Every day | ORAL | 0 refills | Status: DC

## 2022-03-30 MED ORDER — PRAZOSIN HCL 1 MG PO CAPS: 1.0000 mg | ORAL_CAPSULE | Freq: Every day | ORAL | 1 refills | Status: DC

## 2022-03-30 MED ORDER — PROPRANOLOL HCL 10 MG PO TABS: 10.0000 mg | ORAL_TABLET | Freq: Two times a day (BID) | ORAL | 1 refills | Status: DC | PRN

## 2022-03-30 NOTE — Progress Notes (Unsigned)
[facility] OP Progress Note  03/30/2022 4:57 PM Michelle Richardson  MRN:  [id number]  Chief Complaint:  Chief Complaint  Patient presents with   Follow-up   Anxiety   Depression   attention and concentration deficit   HPI: Ms.Michelle Richardson is a 21 year old, African-American female, lives in [place] with her mother, currently works at [employer], graduated from [employer], has a history of MDD, GAD, presented to reestablish care with this clinic, used to be under the care of Dr.Umrania previously, this being her first visit with this provider.  Patient reports she has been going through another episode of depression, reports her depression symptoms as sadness, anhedonia, low motivation, low energy, sleep problems especially due to nightmares.  She reports her primary care provider Dr. Ancil Boozer started her on duloxetine recently, she is currently taking a 60 mg.  She does not know if this medication is helpful.  Currently denies side effects.  Patient also reports anxiety, calls herself a Research officer, trade union, worries about her job, worries about her family, worries about everything to the extreme.  She reports she is often nervous, anxious, feels overwhelmed and is unable to relax.  She is extremely fidgety often.  This has been going on since the past several years, getting worse.  Does not know if the Cymbalta at this dosage is beneficial.  She has been in psychotherapy in the past, motivated to restart psychotherapy.  Patient does report a history of trauma, she was bullied while in school.  Denies any other history of trauma at this time.  Does report a history of nightmares, continues to have it couple of times a week.  That does have an effect on her sleep.  Her nightmares are usually related.  Used to be on prazosin in the past.  Agreeable to retrial.  Patient reports she struggles with her attention, concentration, is often restless, procrastinates a lot, easily distracted, hyperactive often, may have had this since the  age of 59 or 39.  Reports she usually got B's at school.  In 2020 she had an inpatient behavioral health admission for depression and it was around the time when the pandemic started.  Patient reports going back to school was extremely difficult for her since she could not focus, could not comprehend.  Test taking is extremely anxiety provoking for her.  Patient currently works at [employer] and reports she has been doing okay at her work.  She is planning to start [employer] soon.  Looks forward to that.  She reports she may have been told she has ADHD in the past however never formally diagnosed.  Interested in testing.  Patient currently denies any suicidality, homicidality or perceptual disturbances.  Denies any manic or hypomanic symptoms.  Patient denies any history of substance abuse problems.  Denies any other concerns today.  Visit Diagnosis:    ICD-10-CM   1. Moderate episode of recurrent major depressive disorder (HCC)  F33.1 DULoxetine (CYMBALTA) 20 MG capsule    prazosin (MINIPRESS) 1 MG capsule    2. Generalized anxiety disorder  F41.1 DULoxetine (CYMBALTA) 20 MG capsule    propranolol (INDERAL) 10 MG tablet    3. Attention and concentration deficit  R41.840       Past Psychiatric History: Patient used to be under the care of [place] 2020-last visit 11/08/2019.  Patient was diagnosed with MDD, GAD. Patient with 1 inpatient behavioral health admission-[place]-03/18/2019. Patient reports 2 suicide attempts-1 in sixth grade when she attempted to swallow pills however could not  do it.  Another one when she was 21 years old and she tried to cut herself. Patient does report a history of self-injurious behaviors of cutting for emotional relief. She used to be under the care of therapist-Ms. Christina Hussami. Past trials of medications-multiple including Wellbutrin, hydroxyzine, Lexapro  Past Medical History: Denies any history of head injuries, seizures. Past Medical  History:  Diagnosis Date   Asthma    Pediatric obesity    Suicide ideation 01/11/2019    Past Surgical History:  Procedure Laterality Date   TONSILLECTOMY AND ADENOIDECTOMY  08/02/2008    Family Psychiatric History: As noted below  Family History:  Family History  Problem Relation Age of Onset   Hypertension Mother    Drug abuse Father    Autism spectrum disorder Sister     Social History: Patient was born and raised in [place].  She was primarily raised by mother and maternal grandmother.  She reports her father was not very involved.  Patient has 2 sisters.  She continues to live with her mom in [place].  She just graduated high school.  She is currently working for [employer] full time.  Has been with him since the past 1 year.  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  She is single.  Denies having children.  Denies any history of being in [employer].  Denies any history of legal problems.  Does report a history of trauma as noted above. Social History   Socioeconomic History   Marital status: Single    Spouse name: Not on file   Number of children: 0   Years of education: Not on file   Highest education level: High school graduate  Occupational History   Not on file  Tobacco Use   Smoking status: Never   Smokeless tobacco: Never  Vaping Use   Vaping Use: Never used  Substance and Sexual Activity   Alcohol use: Never   Drug use: Never   Sexual activity: Not Currently    Birth control/protection: Abstinence  Other Topics Concern   Not on file  Social History Narrative   Not on file   Social Determinants of Health   Financial Resource Strain: Low Risk  (01/05/2022)   Overall Financial Resource Strain (CARDIA)    Difficulty of Paying Living Expenses: Not hard at all  Food Insecurity: No Food Insecurity (02/08/2022)   Hunger Vital Sign    Worried About Running Out of Food in the Last Year: Never true    Ran Out of Food in the Last Year: Never  true  Transportation Needs: No Transportation Needs (02/08/2022)   PRAPARE - Administrator, Civil Service (Medical): No    Lack of Transportation (Non-Medical): No  Physical Activity: Sufficiently Active (02/08/2022)   Exercise Vital Sign    Days of Exercise per Week: 5 days    Minutes of Exercise per Session: 80 min  Stress: Stress Concern Present (02/08/2022)   [employer] of Occupational Health - Occupational Stress Questionnaire    Feeling of Stress : Very much  Social Connections: Moderately Integrated (02/08/2022)   Social Connection and Isolation Panel [NHANES]    Frequency of Communication with Friends and Family: More than three times a week    Frequency of Social Gatherings with Friends and Family: Twice a week    Attends Religious Services: More than 4 times per year    Active Member of [employer] or Organizations: Yes    Attends Banker  Meetings: Never    Marital Status: Never married    Allergies: No Known Allergies  Metabolic Disorder Labs: Lab Results  Component Value Date   HGBA1C 5.8 (H) 01/06/2022   MPG 120 01/06/2022   MPG 126 01/11/2019   Lab Results  Component Value Date   PROLACTIN 11.1 01/06/2022   PROLACTIN 24.1 (H) 01/11/2019   Lab Results  Component Value Date   CHOL 153 01/06/2022   TRIG 66 01/06/2022   HDL 58 01/06/2022   CHOLHDL 2.6 01/06/2022   VLDL 12 01/11/2019   LDLCALC 80 01/06/2022   LDLCALC 91 01/11/2019   Lab Results  Component Value Date   TSH 0.73 01/06/2022   TSH 0.863 01/11/2019    Therapeutic Level Labs: No results found for: "LITHIUM" No results found for: "VALPROATE" No results found for: "CBMZ"  Current Medications: Current Outpatient Medications  Medication Sig Dispense Refill   budesonide-formoterol (SYMBICORT) 160-4.5 MCG/ACT inhaler Inhale 2 puffs into the lungs 2 (two) times daily. 1 each 2   DULoxetine (CYMBALTA) 20 MG capsule Take 1 capsule (20 mg total) by mouth daily. Take  along with 60 mg daily - total of 80 mg daily 90 capsule 0   DULoxetine (CYMBALTA) 60 MG capsule TAKE 1 CAPSULE BY MOUTH EVERY DAY 90 capsule 0   prazosin (MINIPRESS) 1 MG capsule Take 1 capsule (1 mg total) by mouth at bedtime. 30 capsule 1   propranolol (INDERAL) 10 MG tablet Take 1 tablet (10 mg total) by mouth 2 (two) times daily as needed. For anxiety 60 tablet 1   Vitamin D, Ergocalciferol, (DRISDOL) 1.25 MG (50000 UNIT) CAPS capsule Take 1 capsule (50,000 Units total) by mouth every 7 (seven) days. Please take every Thursday starting 01/17/2019 12 capsule 1   No current facility-administered medications for this visit.     Musculoskeletal: Strength & Muscle Tone: within normal limits Gait & Station: normal Patient leans: N/A  Psychiatric Specialty Exam: Review of Systems  Psychiatric/Behavioral:  Positive for decreased concentration, dysphoric mood and sleep disturbance. The patient is nervous/anxious.   All other systems reviewed and are negative.   Blood pressure 133/88, pulse 91, temperature 98.3 F (36.8 C), temperature source Oral, height 5\' 10"  (1.778 m), weight (!) 316 lb 6.4 oz (143.5 kg).Body mass index is 45.4 kg/m.  General Appearance: Casual  Eye Contact:  Fair  Speech:  Clear and Coherent  Volume:  Normal  Mood:  Anxious and Depressed  Affect:  Congruent  Thought Process:  Goal Directed and Descriptions of Associations: Intact  Orientation:  Full (Time, Place, and Person)  Thought Content: Logical   Suicidal Thoughts:  No  Homicidal Thoughts:  No  Memory:  Immediate;   Fair Recent;   Fair Remote;   Fair  Judgement:  Fair  Insight:  Fair  Psychomotor Activity:  Normal  Concentration:  Concentration: Fair and Attention Span: Fair  Recall:  of Knowledge: Fair  Language: Fair  Akathisia:  No  Handed:  Right  AIMS (if indicated): not done  Assets:  Communication Skills Desire for Improvement Housing Social  Support Talents/Skills Transportation  ADL's:  Intact  Cognition: WNL  Sleep:  Poor   Screenings: AIMS    Flowsheet Row [facility]  AIMS Total Score 0      GAD-7    Flowsheet Row Office Visit from 03/30/2022 in [facility] Office Visit from 02/08/2022 in [facility]  Office Visit from 02/02/2022 in [facility]  Total GAD-7 Score 19 13 16       PHQ2-9    [facility] Office Visit from 03/30/2022 in [facility] Office Visit from 02/08/2022 in [facility] Office Visit from 02/02/2022 in [facility] Office Visit from 01/05/2022 in [facility]  PHQ-2 Total Score 4 4 6 6   PHQ-9 Total Score 22 18 23 19       [facility] Office Visit from 03/30/2022 in [facility] Admission (Discharged) from 01/10/2019 in [facility] CHILD/[facility]  C-SSRS RISK CATEGORY No Risk High Risk        Assessment and Plan: Ms.Michelle Richardson is a 21 year old African-American female with history of depression, anxiety, presented to reestablish care.  Patient currently struggling with depression, anxiety, recently started on Cymbalta by primary care provider, will benefit from medication readjustment, referral to psychotherapy.  Patient with attention and focus deficit, may benefit from referral for ADHD testing.  Plan as noted below.  Plan  MDD-unstable Increase Cymbalta to 80 mg p.o. daily Add prazosin 1 mg p.o. nightly for nightmares. Patient to work on sleep hygiene techniques.  Will consider adding a sleep medication in the future. Referral for CBT-with therapist at our practice.  I have communicated with staff.  GAD-unstable Increase Cymbalta to 80 mg p.o. daily Start propranolol 10 mg p.o. twice daily as needed for  anxiety.  Attention and concentration deficit-unstable Patient scored high on adult ADHD rating scale. Will refer for neuropsychological testing-[facility].  I have completed referral form and Janett Billow CMA to fax it.  I have reviewed notes per Dr. Jenness Corner 02/08/2022, 02/02/2022-patient was started on duloxetine 60 mg p.o. daily.  Patient also with iron deficiency anemia due to chronic blood loss.'  I have reviewed notes per Dr. Pricilla Larsson -most recent, 11/08/2019-patient with MDD, GAD-was continued on Lexapro, hydroxyzine as needed, as well as prazosin for nightmares.  I have reviewed and discussed labs-TSH most recent 01/06/2022-within normal limits.  Follow-up in clinic in 4 to 7 weeks or sooner if needed.    This note was generated in part or whole with voice recognition software. Voice recognition is usually quite accurate but there are transcription errors that can and very often do occur. I apologize for any typographical errors that were not detected and corrected.    Ursula Alert, MD 03/31/2022, 5:49 PM

## 2022-03-30 NOTE — Patient Instructions (Signed)
Propranolol Tablets What is this medication? PROPRANOLOL (proe PRAN oh lole) treats many conditions such as high blood pressure, tremors, and a type of arrhythmia known as AFib (atrial fibrillation). It works by lowering your blood pressure and heart rate, making it easier for your heart to pump blood to the rest of your body. It may be used to prevent migraine headaches. It works by relaxing the blood vessels in the brain that cause migraines. It belongs to a group of medications called beta blockers. This medicine may be used for other purposes; ask your health care provider or pharmacist if you have questions. COMMON BRAND NAME(S): Inderal What should I tell my care team before I take this medication? They need to know if you have any of these conditions: Circulation problems or blood vessel disease Diabetes History of heart attack or heart disease, vasospastic angina Kidney disease Liver disease Lung or breathing disease, like asthma or emphysema Pheochromocytoma Slow heart rate Thyroid disease An unusual or allergic reaction to propranolol, other beta-blockers, medications, foods, dyes, or preservatives Pregnant or trying to get pregnant Breast-feeding How should I use this medication? Take this medication by mouth. Take it as directed on the prescription label at the same time every day. Keep taking it unless your care team tells you to stop. Talk to your care team about the use of this medication in children. Special care may be needed. Overdosage: If you think you have taken too much of this medicine contact a poison control center or emergency room at once. NOTE: This medicine is only for you. Do not share this medicine with others. What if I miss a dose? If you miss a dose, take it as soon as you can. If it is almost time for your next dose, take only that dose. Do not take double or extra doses. What may interact with this medication? Do not take this medication with any of the  following: Feverfew Phenothiazines like chlorpromazine, mesoridazine, prochlorperazine, thioridazine This medication may also interact with the following: Aluminum hydroxide gel Antipyrine Antiviral medications for HIV or AIDS Barbiturates like phenobarbital Certain medications for blood pressure, heart disease, irregular heart beat Cimetidine Ciprofloxacin Diazepam Fluconazole Haloperidol Isoniazid Medications for cholesterol like cholestyramine or colestipol Medications for mental depression Medications for migraine headache like almotriptan, eletriptan, frovatriptan, naratriptan, rizatriptan, sumatriptan, zolmitriptan NSAIDs, medications for pain and inflammation, like ibuprofen or naproxen Phenytoin Rifampin Teniposide Theophylline Thyroid medications Tolbutamide Warfarin Zileuton This list may not describe all possible interactions. Give your health care provider a list of all the medicines, herbs, non-prescription drugs, or dietary supplements you use. Also tell them if you smoke, drink alcohol, or use illegal drugs. Some items may interact with your medicine. What should I watch for while using this medication? Visit your care team for regular checks on your progress. Check your blood pressure as directed. Ask your care team what your blood pressure should be. Also, find out when you should contact him or her. Do not treat yourself for coughs, colds, or pain while you are using this medication without asking your care team for advice. Some medications may increase your blood pressure. You may get drowsy or dizzy. Do not drive, use machinery, or do anything that needs mental alertness until you know how this medication affects you. Do not stand up or sit up quickly, especially if you are an older patient. This reduces the risk of dizzy or fainting spells. Alcohol may interfere with the effect of this medication. Avoid alcoholic drinks.   This medication may increase blood sugar.  Ask your care team if changes in diet or medications are needed if you have diabetes. What side effects may I notice from receiving this medication? Side effects that you should report to your care team as soon as possible: Allergic reactions--skin rash, itching, hives, swelling of the face, lips, tongue, or throat Heart failure--shortness of breath, swelling of the ankles, feet, or hands, sudden weight gain, unusual weakness or fatigue Low blood pressure--dizziness, feeling faint or lightheaded, blurry vision Raynaud's--cool, numb, or painful fingers or toes that may change color from pale, to blue, to red Redness, blistering, peeling, or loosening of the skin, including inside the mouth Slow heartbeat--dizziness, feeling faint or lightheaded, confusion, trouble breathing, unusual weakness or fatigue Worsening mood, feelings of depression Side effects that usually do not require medical attention (report to your care team if they continue or are bothersome): Change in sex drive or performance Diarrhea Dizziness Fatigue Headache This list may not describe all possible side effects. Call your doctor for medical advice about side effects. You may report side effects to FDA at 1-800-FDA-1088. Where should I keep my medication? Keep out of the reach of children and pets. Store at room temperature between 20 and 25 degrees C (68 and 77 degrees F). Protect from light. Throw away any unused medication after the expiration date. NOTE: This sheet is a summary. It may not cover all possible information. If you have questions about this medicine, talk to your doctor, pharmacist, or health care provider.  2023 Elsevier/Gold Standard (2020-07-19 00:00:00)  

## 2022-03-31 ENCOUNTER — Encounter: Payer: Self-pay | Admitting: Psychology

## 2022-03-31 DIAGNOSIS — R41840 Attention and concentration deficit: Secondary | ICD-10-CM | POA: Insufficient documentation

## 2022-04-11 ENCOUNTER — Telehealth: Payer: Self-pay | Admitting: Psychiatry

## 2022-04-11 NOTE — Telephone Encounter (Signed)
Received a letter regarding this patient from [facility] that patient's health insurance currently not in network for the neuropsychological testing.  Patient may benefit from referral to [facility]-[phone]  Or Dr. Viviann Spare [place] -[facility].  Attempted to contact patient to discuss however had to leave a voicemail.  Will route this message to Shanda Bumps CMA so she can address once she is back.

## 2022-04-15 ENCOUNTER — Other Ambulatory Visit: Payer: Self-pay | Admitting: Family Medicine

## 2022-04-15 ENCOUNTER — Ambulatory Visit (INDEPENDENT_AMBULATORY_CARE_PROVIDER_SITE_OTHER): Payer: BC Managed Care – PPO | Admitting: Licensed Clinical Social Worker

## 2022-04-15 DIAGNOSIS — F331 Major depressive disorder, recurrent, moderate: Secondary | ICD-10-CM | POA: Diagnosis not present

## 2022-04-15 DIAGNOSIS — J4520 Mild intermittent asthma, uncomplicated: Secondary | ICD-10-CM

## 2022-04-15 DIAGNOSIS — F411 Generalized anxiety disorder: Secondary | ICD-10-CM | POA: Diagnosis not present

## 2022-04-15 NOTE — Progress Notes (Signed)
Comprehensive Clinical Assessment (CCA) Note  04/15/2022 Michelle Richardson [id number]  Chief Complaint:  Chief Complaint  Patient presents with   Establish Care   Visit Diagnosis:  Encounter Diagnoses  Name Primary?   Moderate episode of recurrent major depressive disorder (HCC) Yes   Generalized anxiety disorder      Pt presented in person at [facility] office. Pt and LCSW were present during the visit.    Pt is a 21 year old [place] American female who lives with her mother and her two sisters. Pt stated that she is the middle child.   Pt presented in office to establish care for therapy services and completed a CCA and treatment plan in session with LCSW. Pt stated that her mood changes based on the situation that she is facing and that she finds it overwhelming.   Pt stated that she has anxiety all the time. Pt stated that she has had anxiety as [place] as she can remember. Pt stated that she has panic attacks as a result of her anxiety and stated that she had a panic attack on her birthday in October 2021.  Pt stated that she used to get anger about how she felt. Pt stated stated that she has had her depression symptoms for ten years. Pt stated that she has low motivation and stated that she has days were she is tearful.    Pt stated that she uses humor as a way of coping. Pt stated that some nights she has a difficult time sleeping because she is nervous.   Allowed pt to explore thoughts and feelings associated with life situations and external stressors. Encouraged expression of feelings and used empathic listening. Pt was oriented to time, place and situation. LCSW validated the pts feelings and thoughts and showed unconditional positive regard.   Pt stated that she has low self-esteem and that she does not view herself in a good away. Pt stated that she works at [employer]. Pt stated that she has one employee that she does not always get along with.   Pt stated that  she does not have many friends and that she has social anxiety in public. Pt stated that she was bullied throughout school and pt stated that it did not stop until she graduated this year 2023.    Pt was able to explore in session her treatment goals to include work on her self-esteem, self-image and self confidence. Pt stated that she wants to work on finding new ways to cope with her anxiety and depression.Pt stated that she wants to work on emotion regulation and express her emotions appropriately. Pt stated that she wants to work on her anger and managing her anger.   LCSW answered any questions that the pt had about the treatment plan and used motivational interviewing techniques to complete the CCA and treatment plan with the pt. LCSW showed unconditional positive regard and validated the pts thoughts and feelings.     Encouraged pt to take medications as prescribed by their psychiatrist.    Pt denies any history of substance abuse problems.   Pt denies SI/HI or A/V hallucinations. Pt was cooperative during visit and was engaged throughout the visit. Pt does not report any other concerns at the time of visit.      CCA Screening, Triage and Referral (STR)  Patient Reported Information How did you hear about [place]? No data recorded Referral name: No data recorded Referral phone number: No data recorded  Whom do you see  for routine medical problems? No data recorded Practice/Facility Name: No data recorded Practice/Facility Phone Number: No data recorded Name of Contact: No data recorded Contact Number: No data recorded Contact Fax Number: No data recorded Prescriber Name: No data recorded Prescriber Address (if known): No data recorded  What Is the Reason for Your Visit/Call Today? No data recorded How [place] Has This Been Causing You Problems? No data recorded What Do You Feel Would Help You the Most Today? No data recorded  Have You Recently Been in Any Inpatient Treatment  (Hospital/Detox/Crisis Center/28-Day Program)? No  Name/Location of Program/Hospital:No data recorded How [place] Were You There? No data recorded When Were You Discharged? No data recorded  Have You Ever Received Services From [facility] Before? Yes  Who Do You See at [facility]? No data recorded  Have You Recently Had Any Thoughts About Hurting Yourself? No  Are You Planning to Commit Suicide/Harm Yourself At This time? No   Have you Recently Had Thoughts About Hurting Someone Karolee Ohs? No  Explanation: No data recorded  Have You Used Any Alcohol or Drugs in the Past 24 Hours? No  How Beevers Ago Did You Use Drugs or Alcohol? No data recorded What Did You Use and How Much? No data recorded  Do You Currently Have a Therapist/Psychiatrist? Yes  Name of Therapist/Psychiatrist: Dr. Elna Breslow   Have You Been Recently Discharged From Any Office Practice or Programs? No data recorded Explanation of Discharge From Practice/Program: No data recorded    CCA Screening Triage Referral Assessment Type of Contact: Face-to-Face  Is this Initial or Reassessment? No data recorded Date Telepsych consult ordered in [facility]:  No data recorded Time Telepsych consult ordered in [facility]:  No data recorded  Patient Reported Information Reviewed? No data recorded Patient Left Without Being Seen? No data recorded Reason for Not Completing Assessment: No data recorded  Collateral Involvement: No data recorded  Does Patient Have a Court Appointed Legal Guardian? No data recorded Name and Contact of Legal Guardian: No data recorded If Minor and Not Living with Parent(s), Who has Custody? No data recorded Is CPS involved or ever been involved? No data recorded Is APS involved or ever been involved? No data recorded  Patient Determined To Be At Risk for Harm To Self or Others Based on Review of Patient Reported Information or Presenting Complaint? No  Method: No Plan  Availability of Means: No access or  NA  Intent: Vague intent or NA  Notification Required: No data recorded Additional Information for Danger to Others Potential: No data recorded Additional Comments for Danger to Others Potential: No data recorded Are There Guns or Other Weapons in Your Home? No data recorded Types of Guns/Weapons: No data recorded Are These Weapons Safely Secured?                            No data recorded Who Could Verify You Are Able To Have These Secured: No data recorded Do You Have any Outstanding Charges, Pending Court Dates, Parole/Probation? No data recorded Contacted To Inform of Risk of Harm To Self or Others: No data recorded  Location of Assessment: No data recorded  Does Patient Present under Involuntary Commitment? No  IVC Papers Initial File Date: No data recorded  [place] of Residence: No data recorded  Patient Currently Receiving the Following Services: No data recorded  Determination of Need: No data recorded  Options For Referral: No data recorded  CCA Biopsychosocial Intake/Chief Complaint:  anxiety, depression  Current Symptoms/Problems: anxiety, depression   Patient Reported Schizophrenia/Schizoaffective Diagnosis in Past: No   Strengths: arts and doing hair  Preferences: No preferences  Abilities: pt stated that she enjoys painting and sowing   Type of Services Patient Feels are Needed: therapy   Initial Clinical Notes/Concerns: No data recorded  Mental Health Symptoms Depression:  Change in energy/activity; Difficulty Concentrating; Fatigue; Worthlessness; Sleep (too much or little)   Duration of Depressive symptoms: Greater than two weeks   Mania:  Racing thoughts (pt stated that she has small things that are constantly going throuhg her mind)   Anxiety:   Difficulty concentrating; Fatigue; Tension; Worrying; Restlessness; Sleep (pt stated that she gets tense in her body when she is anxious)   Psychosis:  None   Duration of Psychotic symptoms:  No data recorded  Trauma:  None   Obsessions:  None   Compulsions:  None   Inattention:  Forgetful; Loses things; Poor follow-through on tasks   Hyperactivity/Impulsivity:  Fidgets with hands/feet; Always on the go   Oppositional/Defiant Behaviors:  None   Emotional Irregularity:  Unstable self-image (pt stated that she feel bad about herself and has low self- esteem)   Other Mood/Personality Symptoms:  No data recorded   Mental Status Exam Appearance and self-care  Stature:  Average   Weight:  Average weight   Clothing:  Neat/clean   Grooming:  Normal   Cosmetic use:  Age appropriate   Posture/gait:  Normal   Motor activity:  Not Remarkable   Sensorium  Attention:  Normal   Concentration:  Normal   Orientation:  X5   Recall/memory:  Normal   Affect and Mood  Affect:  Appropriate   Mood:  Euthymic   Relating  Eye contact:  Normal   Facial expression:  Responsive   Attitude toward examiner:  Cooperative   Thought and Language  Speech flow: Clear and Coherent   Thought content:  Appropriate to Mood and Circumstances   Preoccupation:  None   Hallucinations:  None   Organization:  No data recorded  [employer] of Knowledge:  Average   Intelligence:  Average   Abstraction:  Normal   Judgement:  Normal   Reality Testing:  Realistic   Insight:  Present   Decision Making:  Normal   Social Functioning  Social Maturity:  Responsible   Social Judgement:  Normal   Stress  Stressors:  Relationship; Work   Coping Ability:  Human resources officer Deficits:  Self-care   Supports:  Friends/Service system     Religion: Religion/Spirituality Are You A Religious Person?: Yes What is Your Religious Affiliation?: Chiropodist: Leisure / Recreation Do You Have Hobbies?: Yes Leisure and Hobbies: crafts  Exercise/Diet: Exercise/Diet Do You Exercise?: Yes What Type of Exercise Do You Do?: Run/Walk How Many  Times a Week Do You Exercise?: 1-3 times a week Have You Gained or Lost A Significant Amount of Weight in the Past Six Months?: Yes-Lost Number of Pounds Lost?: 40 Do You Follow a Special Diet?: No Do You Have Any Trouble Sleeping?: Yes   CCA Employment/Education Employment/Work Situation: Employment / Work Situation Employment Situation: Employed Where is Patient Currently Employed?: [employer] How [employer] has Patient Been Employed?: one year Are You Satisfied With Your Job?: Yes Do You Work More Than One Job?: No Work Stressors: pt stated that one coworker that is difficult to get along with Has Patient ever Been in the  Military?: No  Education: Education Is Patient Currently Attending School?: No Last Grade Completed: 12 Name of High School: [employer] Did Garment/textile technologist From [employer]?: Yes Did You Attend College?: No Did You Attend Graduate School?: No Did You Have An Individualized Education Program (IIEP): No Did You Have Any Difficulty At School?: Yes (pt stated that she was bullied at school.) Were Any Medications Ever Prescribed For These Difficulties?: No   CCA Family/Childhood History Family and Relationship History: Family history Marital status: Single Are you sexually active?: No What is your sexual orientation?: bi-sexual Does patient have children?: No  Childhood History:  Childhood History By whom was/is the patient raised?: Grandparents, Mother Additional childhood history information: pt stated that her grandmother raised her with her mother until she was 56 years old until her grandmother died when she was 19 years old. Description of patient's relationship with caregiver when they were a child: Pt stated that her mother worked three jobs and that her grandmother raised her. Pt stated that her father would come see her like once a week for an hour when she was child. Patient's description of current relationship with people who raised him/her: Pt  stated that she lives with her mother. Pt stated that she is not speaking to her father right now and that he has not be very present in her life. Pt stated that her mother does try to reach out. How were you disciplined when you got in trouble as a child/adolescent?: pt stated that her mother would take her phone and she would get "spanked" Does patient have siblings?: Yes Number of Siblings: 2 Description of patient's current relationship with siblings: pt stated that she is in the middle child and that both of her sisters live in the home with her Did patient suffer any verbal/emotional/physical/sexual abuse as a child?: No Did patient suffer from severe childhood neglect?: No Has patient ever been sexually abused/assaulted/raped as an adolescent or adult?: No Was the patient ever a victim of a crime or a disaster?: No Witnessed domestic violence?: No Has patient been affected by domestic violence as an adult?: No  Child/Adolescent Assessment:     CCA Substance Use Alcohol/Drug Use: Alcohol / Drug Use History of alcohol / drug use?: No history of alcohol / drug abuse Longest period of sobriety (when/how Rodas): pt denies any drug or alcohol use                         ASAM's:  Six Dimensions of Multidimensional Assessment  Dimension 1:  Acute Intoxication and/or Withdrawal Potential:      Dimension 2:  Biomedical Conditions and Complications:      Dimension 3:  Emotional, Behavioral, or Cognitive Conditions and Complications:     Dimension 4:  Readiness to Change:     Dimension 5:  Relapse, Continued use, or Continued Problem Potential:     Dimension 6:  Recovery/Living Environment:     ASAM Severity Score:    ASAM Recommended Level of Treatment:     Substance use Disorder (SUD)    Recommendations for Services/Supports/Treatments:    DSM5 Diagnoses: Patient Active Problem List   Diagnosis Date Noted   Attention and concentration deficit 03/31/2022   Morbid  obesity with BMI of 40.0-44.9, adult (HCC) 01/05/2022   Vitamin D deficiency 01/05/2022   Asthma, mild intermittent, poorly controlled 01/05/2022   Generalized anxiety disorder 04/16/2019   Moderate episode of recurrent major depressive disorder (HCC)  04/16/2019    Patient Centered Plan: Patient is on the following Treatment Plan(s):  Anxiety, Depression, and Low Self-Esteem Active     Anger Management     STG: Asuka will identify situations, thoughts, and feelings that trigger internal anger, and/or angry/aggressive actions as evidenced by self-report (Initial)     Start:  04/15/22    Expected End:  10/23/22         Anger Management  (Initial)     Start:  04/15/22    Expected End:  10/23/22      Reduce overall frequency, intensity and duration of anger so that daily functioning is not impaired per pt self report 3 out of 5 sessions documented.          Anxiety     Pt stated that she wants to cope with her anxiety     LTG: Laiba will score less than 5 on the Generalized Anxiety Disorder 7 Scale (GAD-7)  (Initial)     Start:  04/15/22    Expected End:  10/23/22         STG: Tyshia will participate in at least 80% of scheduled individual psychotherapy sessions  (Initial)     Start:  04/15/22    Expected End:  10/23/22         Anxiety  (Initial)     Start:  04/15/22    Expected End:  10/23/22      Reduce overall frequency, intensity and duration of anxiety so that daily functioning is not impaired per pt self report 3 out of 5 sessions.        Anxiety  (Initial)     Start:  04/15/22    Expected End:  10/23/22      Resolve core conflicts that is the source of the anxiety per pt report 3 out of 5 sessions.        Anxiety  (Initial)     Start:  04/15/22    Expected End:  10/23/22      Stabilize anxiety level while increasing ability to function on a daily basis as reported by pt 3 out of 5 sessions.          OP Depression     Pt stated that she wants to work on  coping with her depression     LTG: Reduce frequency, intensity, and duration of depression symptoms so that daily functioning is improved (Initial)     Start:  04/15/22    Expected End:  10/23/22         LTG: Increase coping skills to manage depression and improve ability to perform daily activities (Initial)     Start:  04/15/22    Expected End:  10/23/22         STG: Michelle Piper will attend at least 80% of scheduled group psychotherapy sessions  (Initial)     Start:  04/15/22    Expected End:  10/23/22         Depression  (Initial)     Start:  04/15/22    Expected End:  10/23/22      Reduce overall frequency, intensity and duration of depression so that daily functioning is not impaired per pt self report 3 out of 5 sessions documented.        Depression  (Initial)     Start:  04/15/22    Expected End:  10/23/22      Recognize, accept and cope with feelings of depression per pt self report 3  out of 5 sessions.        Depression  (Initial)     Start:  04/15/22    Expected End:  10/23/22      Develop healthy thinking patterns about self, others and beliefs about self to help alleviate symptoms of depression per pt report 3 out 5 sessions.        Depression  (Initial)     Start:  04/15/22    Expected End:  10/23/22      Appropriately grief loses in order to normalize mood and improve symptoms of depression per pt report 3 out 5 sessions.        Depression  (Initial)     Start:  04/15/22    Expected End:  10/23/22      Develop healthy interpersonal relationships to help prevent relapse of depression symptoms per pt report 3 out of 5 sessions.          Self Esteem:         Indentifies positive aspects of self (Initial)     Start:  04/15/22    Expected End:  10/23/22         Self Esteem  (Initial)     Start:  04/15/22    Expected End:  10/23/22      Will Work with patient to decrease the frequency of negative self-descriptive statements and increase the frequency of  positive self- descriptive statements using CBT/DBT/REBT techniques per patient self report 3 out of 5 documented sessions. Some of the techniques that will be used will be CBT, positive affirmations, role playing, modeling, homework and journaling.          Social Interpersonal Effectiveness     Speak in a clear and concise way so that others full understand pt, per pt report 3 out of 5 documented sessions.      LTG: Alaira will attend and participate in therapeutic, recreational and educational activities that support interpersonal effectiveness   (Initial)     Start:  04/15/22    Expected End:  10/23/22         LTG: Pricila will recognize socially inappropriate behaviors and develop alternative behaviors (Initial)     Start:  04/15/22    Expected End:  10/23/22         Social Interpersonal  (Initial)     Start:  04/15/22    Expected End:  10/23/22      Speak in a clear and concise way so that others full understand pt, per pt report 3 out of 5 documented sessions.        Communication  (Initial)     Start:  04/15/22    Expected End:  10/23/22      Learn three ways to communicate verbally when angry per pt report 3 out of 5 documented sessions. Be able to express wants and needs through spoken language. Learn to express feelings verbally without acting out per pt report 3 out of 5 documented sessions.            Referrals to Alternative Service(s): Referred to Alternative Service(s):   Place:   Date:   Time:    Referred to Alternative Service(s):   Place:   Date:   Time:    Referred to Alternative Service(s):   Place:   Date:   Time:    Referred to Alternative Service(s):   Place:   Date:   Time:      Collaboration of Care: Pt encouraged  to continue care with psychiatrist of record Dr. Elna BreslowEappen.    Patient/Guardian was advised Release of Information must be obtained prior to any record release in order to collaborate their care with an outside provider. Patient/Guardian was  advised if they have not already done so to contact the registration department to sign all necessary forms in order for us to release information regarding their care.   Consent: Patient/Guardian gives verbal consent for treatment and assignment of benefits for services provided during this visit. Patient/Guardian expressed understanding and agreed to proceed.   Holli Humblesarol A Vista Sawatzky

## 2022-04-26 ENCOUNTER — Telehealth: Payer: Self-pay | Admitting: Psychiatry

## 2022-04-26 ENCOUNTER — Other Ambulatory Visit: Payer: Self-pay | Admitting: Psychiatry

## 2022-04-26 DIAGNOSIS — F411 Generalized anxiety disorder: Secondary | ICD-10-CM

## 2022-04-26 MED ORDER — PROPRANOLOL HCL 10 MG PO TABS: 10.0000 mg | ORAL_TABLET | Freq: Two times a day (BID) | ORAL | 0 refills | Status: DC | PRN

## 2022-04-26 NOTE — Telephone Encounter (Signed)
I have sent propranolol to [facility].  Also discussed with patient about neuropsychological testing, [facility] is not in network for neuropsychological testing per her health insurance plan.  Will refer this patient to another neuropsychologist-[place] [place] [facility].  Or Dr. Rainey Pines.  Will route this message to Ms.Jessica CMA to assist.

## 2022-05-04 NOTE — Progress Notes (Unsigned)
Name: Michelle Richardson   MRN: [id number]    DOB: Mar 27, 2001   Date:05/05/2022       Progress Note  Subjective  Chief Complaint  Follow Up  HPI  GAD/MDD: she states she has been depressed since around age 22, during her teen years she had suicidal thoughts and twice she took extra pills trying to commit suicide. She has tried wellbutrin and Celexa but first medication she is not sure why she stopped and celexa made her feel numb but still depressed. She has seen a psychiatrist in the past and has been admitted to a psychiatric facility once. She graduated HS in 2023, currently working part time at [employer]. She has a supportive family  She has noticed some suicidal thoughts - no planning - she states that she would not do it , does not want to upset her family. She is currently seeing a therapist and a psychiatrist, taking a total of 80 mg of Duloxetine and also on Prazosin , she states she is feeling a little worse lately, she is not sure why. She has been feeling more nervous than usual   Iron deficiency anemia: she has been taking otc iron supplements about 3 times a week, she does not want to have repeat labs today   Viral: she developed nasal congestion and rhinorrhea, some sore throat and fever three days ago, using essential oils and vitamin C, she is feeling better. Today she has a mild cough, but no wheezing or SOB  Asthma mild intermittent: she still has Symbicort at home, uses prn  Morbid obesity: down a few pounds since last visit. Discussed weight loss medications, she is willing to try it. She denies family history of thyroid cancer. She denies personal history of pancreatitis. Discussed possible side effects   Patient Active Problem List   Diagnosis Date Noted   Attention and concentration deficit 03/31/2022   Morbid obesity with BMI of 40.0-44.9, adult ([place]) 01/05/2022   Vitamin D deficiency 01/05/2022   Asthma, mild intermittent, poorly controlled 01/05/2022   Generalized anxiety  disorder 04/16/2019   Moderate episode of recurrent major depressive disorder ([place]) 04/16/2019    Past Surgical History:  Procedure Laterality Date   TONSILLECTOMY AND ADENOIDECTOMY  08/02/2008    Family History  Problem Relation Age of Onset   Hypertension Mother    Drug abuse Father    Autism spectrum disorder Sister     Social History   Tobacco Use   Smoking status: Never   Smokeless tobacco: Never  Substance Use Topics   Alcohol use: Never     Current Outpatient Medications:    budesonide-formoterol (SYMBICORT) 160-4.5 MCG/ACT inhaler, INHALE 2 PUFFS INTO THE LUNGS TWICE A DAY, Disp: 10.2 each, Rfl: 0   DULoxetine (CYMBALTA) 20 MG capsule, Take 1 capsule (20 mg total) by mouth daily. Take along with 60 mg daily - total of 80 mg daily, Disp: 90 capsule, Rfl: 0   DULoxetine (CYMBALTA) 60 MG capsule, TAKE 1 CAPSULE BY MOUTH EVERY DAY, Disp: 90 capsule, Rfl: 0   prazosin (MINIPRESS) 1 MG capsule, Take 1 capsule (1 mg total) by mouth at bedtime., Disp: 30 capsule, Rfl: 1   propranolol (INDERAL) 10 MG tablet, Take 1 tablet (10 mg total) by mouth 2 (two) times daily as needed. For anxiety, Disp: 180 tablet, Rfl: 0   tirzepatide (ZEPBOUND) 2.5 MG/0.5ML Pen, Inject 2.5 mg into the skin once a week., Disp: 2 mL, Rfl: 0   Vitamin D, Ergocalciferol, (DRISDOL) 1.25 MG (  50000 UNIT) CAPS capsule, Take 1 capsule (50,000 Units total) by mouth every 7 (seven) days. Please take every Thursday starting 01/17/2019, Disp: 12 capsule, Rfl: 1  No Known Allergies  I personally reviewed active problem list, medication list, allergies, family history, social history, health maintenance with the patient/caregiver today.   ROS  Ten systems reviewed and is negative except as mentioned in HPI   Objective  Vitals:   05/05/22 0937  BP: 126/82  Pulse: 96  Resp: 16  Temp: 98.2 F (36.8 C)  TempSrc: Oral  SpO2: 100%  Weight: (!) 313 lb 12.8 oz (142.3 kg)  Height: 5\' 10"  (1.778 m)    Body  mass index is 45.03 kg/m.  Physical Exam  Constitutional: Patient appears well-developed and well-nourished. Obese  No distress.  HEENT: head atraumatic, normocephalic, pupils equal and reactive to light, neck supple Cardiovascular: Normal rate, regular rhythm and normal heart sounds.  No murmur heard. No BLE edema. Pulmonary/Chest: Effort normal and breath sounds normal. No respiratory distress. Abdominal: Soft.  There is no tenderness. Psychiatric: Patient has a normal mood and affect. behavior is normal. Judgment and thought content normal.    PHQ2/9:    05/05/2022    9:31 AM 04/15/2022    9:05 AM 03/30/2022    1:46 PM 02/08/2022   11:50 AM 02/02/2022   11:29 AM  Depression screen PHQ 2/9  Decreased Interest 1   2 3   Down, Depressed, Hopeless 2   2 3   PHQ - 2 Score 3   4 6   Altered sleeping 3   3 3   Tired, decreased energy 2   2 2   Change in appetite 3   2 3   Feeling bad or failure about yourself  2   3 3   Trouble concentrating 3   3 3   Moving slowly or fidgety/restless 2   1 2   Suicidal thoughts 0   0 1  PHQ-9 Score 18   18 23   Difficult doing work/chores Somewhat difficult   Somewhat difficult Somewhat difficult     Information is confidential and restricted. Go to Review Flowsheets to unlock data.    phq 9 is positive, under the care of psychiatrist    Fall Risk:    05/05/2022    9:31 AM 02/08/2022   11:31 AM 02/02/2022   11:23 AM 01/05/2022   11:29 AM  Fall Risk   Falls in the past year? 0 0 0 0  Number falls in past yr: 0   0  Injury with Fall? 0   0  Risk for fall due to : No Fall Risks No Fall Risks No Fall Risks No Fall Risks  Follow up Falls prevention discussed;Education provided;Falls evaluation completed Falls prevention discussed;Education provided;Falls evaluation completed Falls prevention discussed;Education provided;Falls evaluation completed Falls prevention discussed      Assessment & Plan   1. Iron deficiency anemia due to chronic blood  loss  Reminded her to take iron supplementation   2. Moderate episode of recurrent major depressive disorder (HCC)  Continue follow up with psychiatrist and therapist, explained sometimes it is normal to feel worse before she feels better   3. Viral URI  Improving, normal lung exam   4. Morbid obesity (HCC)  Discussed life style modification, she wants to go back to the gym, willing to try medication  - tirzepatide (ZEPBOUND) 2.5 MG/0.5ML Pen; Inject 2.5 mg into the skin once a week.  Dispense: 2 mL; Refill: 0   5. Asthma, mild intermittent,  poorly controlled  Continue prn medication

## 2022-05-05 ENCOUNTER — Ambulatory Visit: Payer: BC Managed Care – PPO | Admitting: Family Medicine

## 2022-05-05 ENCOUNTER — Encounter: Payer: Self-pay | Admitting: Family Medicine

## 2022-05-05 VITALS — BP 126/82 | HR 96 | Temp 98.2°F | Resp 16 | Ht 70.0 in | Wt 313.8 lb

## 2022-05-05 DIAGNOSIS — D500 Iron deficiency anemia secondary to blood loss (chronic): Secondary | ICD-10-CM

## 2022-05-05 DIAGNOSIS — J4520 Mild intermittent asthma, uncomplicated: Secondary | ICD-10-CM

## 2022-05-05 DIAGNOSIS — Z6841 Body Mass Index (BMI) 40.0 and over, adult: Secondary | ICD-10-CM

## 2022-05-05 DIAGNOSIS — F331 Major depressive disorder, recurrent, moderate: Secondary | ICD-10-CM | POA: Diagnosis not present

## 2022-05-05 DIAGNOSIS — J069 Acute upper respiratory infection, unspecified: Secondary | ICD-10-CM

## 2022-05-05 MED ORDER — ZEPBOUND 2.5 MG/0.5ML ~~LOC~~ SOAJ: 2.5000 mg | SUBCUTANEOUS | 0 refills | Status: DC

## 2022-05-11 ENCOUNTER — Ambulatory Visit: Payer: BC Managed Care – PPO | Admitting: Licensed Clinical Social Worker

## 2022-05-13 ENCOUNTER — Encounter (HOSPITAL_COMMUNITY): Payer: Self-pay

## 2022-05-13 NOTE — Telephone Encounter (Signed)
Opened in error

## 2022-05-17 ENCOUNTER — Ambulatory Visit: Payer: BC Managed Care – PPO | Admitting: Licensed Clinical Social Worker

## 2022-05-17 DIAGNOSIS — F411 Generalized anxiety disorder: Secondary | ICD-10-CM

## 2022-05-17 DIAGNOSIS — R41840 Attention and concentration deficit: Secondary | ICD-10-CM

## 2022-05-17 DIAGNOSIS — F331 Major depressive disorder, recurrent, moderate: Secondary | ICD-10-CM

## 2022-05-17 NOTE — Progress Notes (Unsigned)
THERAPIST PROGRESS NOTE  Session Time: 1:05pm-2:00pm   Participation Level: Active  Behavioral Response: Well GroomedAlertEuthymic  Type of Therapy: Individual Therapy  Treatment Goals addressed: Anxiety: Reduce overall frequency, intensity and duration of anxiety so that daily functioning is not impaired per pt self report 3 out of 5 sessions.    Intervention: Learn and Implement coping skills that result in the reduction of anxiety and worry and improve daily functioning per pt self report 3 out of 5 documented sessions.    ProgressTowards Goals: Progressing  Interventions: CBT, Supportive, and Meditation: Mindfulness Leaves on a Stream   Pt presented in person at [facility] office. Pt and LCSW were present during the visit.    Summary: Michelle Richardson is a 22 y.o. female who presents with continuing symptoms of anxiety and depression. Pt lives with her mother and her two sisters. Pt stated that work has been stressful because she has been working doubles.   Pt was able to explore new ways that she could cope with her anxiety and stress and stated that she enjoys painting and crafts. Pt stated that she would try the new coping skills dicussed in session.   Allowed pt to explore thoughts and feelings associated with life situations and external stressors. Encouraged expression of feelings and used empathic listening. Pt was oriented to time, place and situation. LCSW validated the pts feelings and thoughts and showed unconditional positive regard.   LCSW provided mood monitoring and treatment progress review in the context of this episode of treatment. LCSW reviewed the pt's mood status since last session.  Encouraged pt to take medications as prescribed by their psychiatrist.    Discussed with the pt the transition of a new therapist and provided the pt with the choice of continuing services with the new therapist and answered any questions that the pt had  about the transition.  Pt agreed to the transition to the new therapist.   Pt stated that she has been listening to music and that she enjoys imagery and listening to calming noises like sounds of rain and that helps her calm down. Pt was engaged in the mindfulness activity leaves on a stream and stated that she found it calming.   GAD-7 score on 04/15/2022 was a 20 and on 05/17/2022 GAD-7 score was a 18.   Suicidal/Homicidal: Nowithout intent/plan  Therapist Response: Educated on the importance of healthy coping skills with the pt. Explored the benefits of healthy coping skills and engaged the pt to discuss ways that they are coping with anxiety and depression. Encouraged the pt to explore new ways to help alleviate anxiety and determined new ways to help with distraction and coping in session. Discussed the benefits of exercise as a way of coping with anxiety and stress. Explored different ways that the pt could cope to include listening to music, being in nature, trying a new hobby and other activities that the pt could try to help with coping.    Educated on mindful deep breathing with the pt. Explored how the technique could help alleviate anxiety. Engaged with the pt to inhale for four seconds, then hold their air in their lungs for four seconds and then slowly exhale for six seconds. Encouraged the pt to practice the mindful deep breathing at home or any place where they feel that they need to take a few minutes to focus on breathing and being in the moment. Discussed with the pt how the technique could be effective in  helping with anxiety and is a discreet way to help them stay grounded.   Participated in a mindfulness exercise with the pt: Leaves on a stream and explored how the exercise made the pt feel.   Continued Recommendations as followed: Self-care behaviors, positive social engagements, focusing on positive physical and emotional wellness, and focusing on life/work balance.    Plan:  Discussed with the pt the transition of a new therapist and provided the pt with the choice of continuing services with the new therapist and answered any questions that the pt had about the transition.    Diagnosis:  Encounter Diagnoses  Name Primary?   Generalized anxiety disorder Yes   Moderate episode of recurrent major depressive disorder (HCC)    Attention and concentration deficit      Collaboration of Care: Pt encouraged to continue care with psychiatrist of record Dr. Shea Evans.    Patient/Guardian was advised Release of Information must be obtained prior to any record release in order to collaborate their care with an outside provider. Patient/Guardian was advised if they have not already done so to contact the registration department to sign all necessary forms in order for [place] to release information regarding their care.   Consent: Patient/Guardian gives verbal consent for treatment and assignment of benefits for services provided during this visit. Patient/Guardian expressed understanding and agreed to proceed.   Lorenda Hatchet 05/17/2022

## 2022-05-20 ENCOUNTER — Ambulatory Visit (INDEPENDENT_AMBULATORY_CARE_PROVIDER_SITE_OTHER): Payer: BC Managed Care – PPO | Admitting: Psychiatry

## 2022-05-20 ENCOUNTER — Encounter: Payer: Self-pay | Admitting: Psychiatry

## 2022-05-20 VITALS — BP 140/75 | HR 96 | Temp 97.9°F | Ht 70.0 in | Wt 314.6 lb

## 2022-05-20 DIAGNOSIS — R41840 Attention and concentration deficit: Secondary | ICD-10-CM

## 2022-05-20 DIAGNOSIS — F411 Generalized anxiety disorder: Secondary | ICD-10-CM

## 2022-05-20 DIAGNOSIS — F331 Major depressive disorder, recurrent, moderate: Secondary | ICD-10-CM | POA: Diagnosis not present

## 2022-05-20 MED ORDER — PRAZOSIN HCL 1 MG PO CAPS: 1.0000 mg | ORAL_CAPSULE | Freq: Every day | ORAL | 1 refills | Status: DC

## 2022-05-20 NOTE — Progress Notes (Signed)
BH MD OP Progress Note  05/20/2022 1:36 PM Michelle Richardson  MRN:  [id number]  Chief Complaint:  Chief Complaint  Patient presents with   Follow-up   Anxiety   Depression   Medication Refill   HPI: Michelle Richardson is a 22 year old African-American female, lives in [place], currently works at [employer], has a history of MDD, GAD, attention and focus deficit was evaluated in office today.  Patient today reports she currently has multiple situational stressors.  She does have relationship struggles with peers at work.  That has been stressful.  She also reports she is planning to go to a wedding in [place] however does not know if she will be able to do that since one of her seniors is planning to take the time off from work during that time.  That also has been stressful for her.  She continues to worry, also has restlessness, trouble relaxing often.  Patient reports she does lack motivation to do things, however pushes herself to do certain activities.  Patient reports she is currently working overtime, 2 shifts to cover for someone at work.  She hence has been struggling with sleep.  She often gets out of work late and since she does not do a lot of planning has not been eating good only because of not having any options and restaurants being closed at that time.  Patient denies any suicidality, homicidality or perceptual disturbances.  Reports she has upcoming appointment with neuropsychologist, looks forward to that.  Patient reports she is motivated to stay in therapy.  Denies any side effects to her medications including Cymbalta.  She however has not been using the propranolol much, would rather use her coping strategies.  However when she does use it it does help her.  Denies any other concerns today.  Visit Diagnosis:    ICD-10-CM   1. Moderate episode of recurrent major depressive disorder (HCC)  F33.1 prazosin (MINIPRESS) 1 MG capsule    2. Generalized anxiety disorder  F41.1     3.  Attention and concentration deficit  R41.840       Past Psychiatric History: Reviewed past psychiatric history from progress note on 03/30/2022.  Past Medical History:  Past Medical History:  Diagnosis Date   Asthma    Pediatric obesity    Suicide ideation 01/11/2019    Past Surgical History:  Procedure Laterality Date   TONSILLECTOMY AND ADENOIDECTOMY  08/02/2008    Family Psychiatric History: Reviewed family psych History from progress note on 03/30/2022.  Family History:  Family History  Problem Relation Age of Onset   Hypertension Mother    Drug abuse Father    Autism spectrum disorder Sister     Social History: Reviewed social history from progress note on 03/30/2022. Social History   Socioeconomic History   Marital status: Single    Spouse name: Not on file   Number of children: 0   Years of education: Not on file   Highest education level: High school graduate  Occupational History   Not on file  Tobacco Use   Smoking status: Never   Smokeless tobacco: Never  Vaping Use   Vaping Use: Never used  Substance and Sexual Activity   Alcohol use: Never   Drug use: Never   Sexual activity: Not Currently    Birth control/protection: Abstinence  Other Topics Concern   Not on file  Social History Narrative   Not on file   Social Determinants of Health   Financial Resource Strain:  Low Risk  (01/05/2022)   Overall Financial Resource Strain (CARDIA)    Difficulty of Paying Living Expenses: Not hard at all  Food Insecurity: No Food Insecurity (02/08/2022)   Hunger Vital Sign    Worried About Running Out of Food in the Last Year: Never true    Ran Out of Food in the Last Year: Never true  Transportation Needs: No Transportation Needs (02/08/2022)   PRAPARE - Hydrologist (Medical): No    Lack of Transportation (Non-Medical): No  Physical Activity: Sufficiently Active (02/08/2022)   Exercise Vital Sign    Days of Exercise per Week: 5  days    Minutes of Exercise per Session: 80 min  Stress: Stress Concern Present (02/08/2022)   [place]    Feeling of Stress : Very much  Social Connections: Moderately Integrated (02/08/2022)   Social Connection and Isolation Panel [NHANES]    Frequency of Communication with Friends and Family: More than three times a week    Frequency of Social Gatherings with Friends and Family: Twice a week    Attends Religious Services: More than 4 times per year    Active Member of Clubs or Organizations: Yes    Attends Archivist Meetings: Never    Marital Status: Never married    Allergies: No Known Allergies  Metabolic Disorder Labs: Lab Results  Component Value Date   HGBA1C 5.8 (H) 01/06/2022   MPG 120 01/06/2022   MPG 126 01/11/2019   Lab Results  Component Value Date   PROLACTIN 11.1 01/06/2022   PROLACTIN 24.1 (H) 01/11/2019   Lab Results  Component Value Date   CHOL 153 01/06/2022   TRIG 66 01/06/2022   HDL 58 01/06/2022   CHOLHDL 2.6 01/06/2022   VLDL 12 01/11/2019   [place] 80 01/06/2022   Valinda 91 01/11/2019   Lab Results  Component Value Date   TSH 0.73 01/06/2022   TSH 0.863 01/11/2019    Therapeutic Level Labs: No results found for: "LITHIUM" No results found for: "VALPROATE" No results found for: "CBMZ"  Current Medications: Current Outpatient Medications  Medication Sig Dispense Refill   budesonide-formoterol (SYMBICORT) 160-4.5 MCG/ACT inhaler INHALE 2 PUFFS INTO THE LUNGS TWICE A DAY 10.2 each 0   DULoxetine (CYMBALTA) 20 MG capsule Take 1 capsule (20 mg total) by mouth daily. Take along with 60 mg daily - total of 80 mg daily 90 capsule 0   DULoxetine (CYMBALTA) 60 MG capsule TAKE 1 CAPSULE BY MOUTH EVERY DAY 90 capsule 0   propranolol (INDERAL) 10 MG tablet Take 1 tablet (10 mg total) by mouth 2 (two) times daily as needed. For anxiety 180 tablet 0   Vitamin D,  Ergocalciferol, (DRISDOL) 1.25 MG (50000 UNIT) CAPS capsule Take 1 capsule (50,000 Units total) by mouth every 7 (seven) days. Please take every Thursday starting 01/17/2019 12 capsule 1   prazosin (MINIPRESS) 1 MG capsule Take 1 capsule (1 mg total) by mouth at bedtime. 30 capsule 1   tirzepatide (ZEPBOUND) 2.5 MG/0.5ML Pen Inject 2.5 mg into the skin once a week. (Patient not taking: Reported on 05/20/2022) 2 mL 0   No current facility-administered medications for this visit.     Musculoskeletal: Strength & Muscle Tone: within normal limits Gait & Station: normal Patient leans: N/A  Psychiatric Specialty Exam: Review of Systems  Psychiatric/Behavioral:  Positive for decreased concentration, dysphoric mood and sleep disturbance. The patient is nervous/anxious.  All other systems reviewed and are negative.   Blood pressure (!) 140/75, pulse 96, temperature 97.9 F (36.6 C), temperature source Oral, height 5\' 10"  (1.778 m), weight (!) 314 lb 9.6 oz (142.7 kg), SpO2 98 %.Body mass index is 45.14 kg/m.  General Appearance: Casual  Eye Contact:  Fair  Speech:  Clear and Coherent  Volume:  Normal  Mood:  Anxious and Dysphoric  Affect:  Full Range  Thought Process:  Goal Directed and Descriptions of Associations: Intact  Orientation:  Full (Time, Place, and Person)  Thought Content: Logical   Suicidal Thoughts:  No  Homicidal Thoughts:  No  Memory:  Immediate;   Fair Recent;   Fair Remote;   Fair  Judgement:  Fair  Insight:  Fair  Psychomotor Activity:  Normal  Concentration:  Concentration: Fair and Attention Span: Fair  Recall:  of Knowledge: Fair  Language: Fair  Akathisia:  No  Handed:  Right  AIMS (if indicated): not done  Assets:  Communication Skills Desire for Improvement Housing Social Support  ADL's:  Intact  Cognition: WNL  Sleep:   Poor due to working overtime   Screenings: AIMS    Flowsheet Row [facility]  AIMS Total Score 0      GAD-7    Flowsheet Row Office Visit from 05/20/2022 in [facility] Counselor from 05/17/2022 in [facility] Office Visit from 05/05/2022 in [facility] from 04/15/2022 in [facility] Office Visit from 03/30/2022 in [facility]  Total GAD-7 Score 12 18 13 20 19       PHQ2-9    Flowsheet Row Office Visit from 05/20/2022 in [facility] Counselor from 05/17/2022 in [facility] Office Visit from 05/05/2022 in [facility] from 04/15/2022 in [facility] Office Visit from 03/30/2022 in [facility]  PHQ-2 Total Score 3 4 3 4 4   PHQ-9 Total Score 13 17 18 22 22       Flowsheet Row Office Visit from 05/20/2022 in [facility] Counselor from 05/17/2022 in [facility] Counselor from 04/15/2022 in [facility]  C-SSRS RISK CATEGORY Low Risk No Risk No Risk        Assessment and Plan: Michelle Richardson is a 22 year old African-American female with history of depression, anxiety was evaluated in office today.  Patient with continued mood symptoms attention and focus deficit, does have situational stressors, will benefit from continued psychotherapy sessions, not interested in medication changes at this time, hence will continue the medications as noted below.  Plan as noted below.  Plan MDD-improving Cymbalta 80 mg p.o. daily Prazosin 1 mg p.o. nightly for nightmares Continue CBT  GAD-improving Continue CBT Propranolol 10 mg p.o.  twice daily as needed for anxiety Cymbalta 80 mg p.o. daily.  Attention and concentration deficit-unstable Patient has upcoming appointment with neuropsychologist-Dr. 04/17/2022.  Follow-up in clinic in 2 months or sooner if needed.  This note was generated in part or whole with voice recognition software. Voice recognition is usually quite accurate but there are transcription errors that can and very often do occur. I apologize for any typographical errors that were not detected and corrected.  Ursula Alert, MD 05/20/2022, 1:36 PM

## 2022-05-25 ENCOUNTER — Other Ambulatory Visit: Payer: Self-pay | Admitting: Family Medicine

## 2022-05-25 DIAGNOSIS — J4520 Mild intermittent asthma, uncomplicated: Secondary | ICD-10-CM

## 2022-05-30 ENCOUNTER — Ambulatory Visit: Payer: BC Managed Care – PPO | Admitting: Licensed Clinical Social Worker

## 2022-06-14 ENCOUNTER — Other Ambulatory Visit: Payer: Self-pay | Admitting: Psychiatry

## 2022-06-14 DIAGNOSIS — F331 Major depressive disorder, recurrent, moderate: Secondary | ICD-10-CM

## 2022-06-23 ENCOUNTER — Other Ambulatory Visit: Payer: Self-pay | Admitting: Family Medicine

## 2022-06-23 DIAGNOSIS — J4520 Mild intermittent asthma, uncomplicated: Secondary | ICD-10-CM

## 2022-06-25 ENCOUNTER — Other Ambulatory Visit: Payer: Self-pay | Admitting: Psychiatry

## 2022-06-25 DIAGNOSIS — F411 Generalized anxiety disorder: Secondary | ICD-10-CM

## 2022-06-25 DIAGNOSIS — F331 Major depressive disorder, recurrent, moderate: Secondary | ICD-10-CM

## 2022-07-18 ENCOUNTER — Other Ambulatory Visit: Payer: Self-pay | Admitting: Internal Medicine

## 2022-07-18 DIAGNOSIS — J4520 Mild intermittent asthma, uncomplicated: Secondary | ICD-10-CM

## 2022-07-19 NOTE — Telephone Encounter (Signed)
Requested Prescriptions  Pending Prescriptions Disp Refills   WIXELA INHUB 250-50 MCG/ACT AEPB [Pharmacy Med Name: WIXELA 250-50 INHUB] 180 each 3    Sig: INHALE 1 PUFF INTO THE LUNGS IN THE MORNING AND AT BEDTIME.     Pulmonology:  Combination Products Passed - 07/18/2022  2:42 AM      Passed - Valid encounter within last 12 months    Recent Outpatient Visits           2 months ago Iron deficiency anemia due to chronic blood loss   [facility] Steele Sizer, MD   5 months ago Well adult exam   [facility] Steele Sizer, MD   5 months ago Moderate episode of recurrent major depressive disorder [facility])   [facility] Steele Sizer, MD   6 months ago Moderate episode of recurrent major depressive disorder [facility])   [facility] Steele Sizer, MD       Future Appointments             In 2 weeks Steele Sizer, MD [facility], [place]   In 2 months [place], Minette Brine, [place], [facility]   In 7 months Steele Sizer, MD [facility], [facility]

## 2022-07-23 ENCOUNTER — Other Ambulatory Visit: Payer: Self-pay | Admitting: Psychiatry

## 2022-07-23 DIAGNOSIS — F411 Generalized anxiety disorder: Secondary | ICD-10-CM

## 2022-07-25 ENCOUNTER — Ambulatory Visit: Payer: BC Managed Care – PPO | Admitting: Psychiatry

## 2022-08-04 NOTE — Progress Notes (Signed)
Name: Michelle Richardson   MRN: [id number]    DOB: 10/22/2000   Date:08/05/2022       Progress Note  Subjective  Chief Complaint  Follow Up  HPI  GAD/MDD: she states she has been depressed since around age 22, during her teen years she had suicidal thoughts and twice she took extra pills trying to commit suicide. She has tried wellbutrin and Celexa but first medication she is not sure why she stopped and celexa made her feel numb but still depressed. She has seen a psychiatrist in the past and has been admitted to a psychiatric facility once. She graduated HS in 2030, currently working part time at [employer]. She has a supportive family  She has noticed some suicidal thoughts - no planning - she states that she would not do it , does not want to upset her family. She is currently seeing a therapist and a psychiatrist, taking a total of 80 mg of Duloxetine and also on Prazosin , she had a stressful day yesterday, thinking about buying a new car. She has been compliant with medications  Iron deficiency anemia: she has been taking otc iron supplements about 3 times a week, we will recheck labs today . Her cycles are heavy .   Asthma moderate  intermittent: she uses Symbicort almost daily and has noticed that improves with symptoms. No cough or wheezing. She has noticed some sneezing from the pollen   Morbid obesity: she lost a few pounds since last visit, we sent Zepbound but never got it filled since pharmacy did not contact her. She has been buying her own groceries and doing well on her own. We will contact pharmacy to find out what happened.   Patient Active Problem List   Diagnosis Date Noted   Attention and concentration deficit 03/31/2022   Morbid obesity with BMI of 40.0-44.9, adult 01/05/2022   Vitamin D deficiency 01/05/2022   Asthma, mild intermittent, poorly controlled 01/05/2022   Generalized anxiety disorder 04/16/2019   Moderate episode of recurrent major depressive disorder 04/16/2019     Past Surgical History:  Procedure Laterality Date   TONSILLECTOMY AND ADENOIDECTOMY  08/02/2008    Family History  Problem Relation Age of Onset   Hypertension Mother    Drug abuse Father    Autism spectrum disorder Sister     Social History   Tobacco Use   Smoking status: Never   Smokeless tobacco: Never  Substance Use Topics   Alcohol use: Never     Current Outpatient Medications:    DULoxetine (CYMBALTA) 20 MG capsule, TAKE 1 CAPSULE (20 MG TOTAL) BY MOUTH DAILY. TAKE ALONG WITH 60 MG DAILY - TOTAL OF 80 MG DAILY, Disp: 90 capsule, Rfl: 0   DULoxetine (CYMBALTA) 60 MG capsule, TAKE 1 CAPSULE BY MOUTH EVERY DAY, Disp: 90 capsule, Rfl: 0   fluticasone-salmeterol (WIXELA INHUB) 250-50 MCG/ACT AEPB, INHALE 1 PUFF INTO THE LUNGS IN THE MORNING AND AT BEDTIME., Disp: 180 each, Rfl: 3   prazosin (MINIPRESS) 1 MG capsule, TAKE 1 CAPSULE BY MOUTH AT BEDTIME., Disp: 90 capsule, Rfl: 0   propranolol (INDERAL) 10 MG tablet, TAKE 1 TABLET (10 MG TOTAL) BY MOUTH 2 (TWO) TIMES DAILY AS NEEDED. FOR ANXIETY, Disp: 60 tablet, Rfl: 0   Vitamin D, Ergocalciferol, (DRISDOL) 1.25 MG (50000 UNIT) CAPS capsule, Take 1 capsule (50,000 Units total) by mouth every 7 (seven) days. Please take every Thursday starting 01/17/2019, Disp: 12 capsule, Rfl: 1   tirzepatide (ZEPBOUND) 2.5 MG/0.5ML Pen,  Inject 2.5 mg into the skin once a week. (Patient not taking: Reported on 05/20/2022), Disp: 2 mL, Rfl: 0  No Known Allergies  I personally reviewed active problem list, medication list, allergies, family history, social history, health maintenance with the patient/caregiver today.   ROS  Constitutional: Negative for fever, positive for  weight change.  Respiratory: Negative for cough and shortness of breath.   Cardiovascular: Negative for chest pain or palpitations.  Gastrointestinal: Negative for abdominal pain, no bowel changes.  Musculoskeletal: Negative for gait problem or joint swelling.  Skin:  Negative for rash.  Neurological: Negative for dizziness or headache.  No other specific complaints in a complete review of systems (except as listed in HPI above).   Objective  Vitals:   08/05/22 1054  BP: 134/74  Pulse: 94  Resp: 16  Temp: 97.9 F (36.6 C)  TempSrc: Oral  SpO2: 98%  Weight: (!) 308 lb (139.7 kg)  Height: 5\' 10"  (1.778 m)    Body mass index is 44.19 kg/m.  Physical Exam  Constitutional: Patient appears well-developed and well-nourished. Obese  No distress.  HEENT: head atraumatic, normocephalic, pupils equal and reactive to light, neck supple Cardiovascular: Normal rate, regular rhythm and normal heart sounds.  No murmur heard. No BLE edema. Pulmonary/Chest: Effort normal and breath sounds normal. No respiratory distress. Abdominal: Soft.  There is no tenderness. Psychiatric: Patient has a normal mood and affect. behavior is normal. Judgment and thought content normal.    PHQ2/9:    08/05/2022   11:08 AM 05/20/2022   11:17 AM 05/17/2022    4:56 PM 05/05/2022    9:31 AM 04/15/2022    9:05 AM  Depression screen PHQ 2/9  Decreased Interest 2   1   Down, Depressed, Hopeless 2   2   PHQ - 2 Score 4   3   Altered sleeping 1   3   Tired, decreased energy 2   2   Change in appetite 3   3   Feeling bad or failure about yourself  1   2   Trouble concentrating 3   3   Moving slowly or fidgety/restless 2   2   Suicidal thoughts 0   0   PHQ-9 Score 16   18   Difficult doing work/chores Somewhat difficult   Somewhat difficult      Information is confidential and restricted. Go to Review Flowsheets to unlock data.    phq 9 is positive   Fall Risk:    08/05/2022   10:55 AM 05/05/2022    9:31 AM 02/08/2022   11:31 AM 02/02/2022   11:23 AM 01/05/2022   11:29 AM  Fall Risk   Falls in the past year? 0 0 0 0 0  Number falls in past yr:  0   0  Injury with Fall?  0   0  Risk for fall due to : No Fall Risks No Fall Risks No Fall Risks No Fall Risks No Fall  Risks  Follow up Falls prevention discussed Falls prevention discussed;Education provided;Falls evaluation completed Falls prevention discussed;Education provided;Falls evaluation completed Falls prevention discussed;Education provided;Falls evaluation completed Falls prevention discussed      Functional Status Survey: Is the patient deaf or have difficulty hearing?: No Does the patient have difficulty seeing, even when wearing glasses/contacts?: No Does the patient have difficulty concentrating, remembering, or making decisions?: Yes Does the patient have difficulty walking or climbing stairs?: No Does the patient have difficulty dressing or bathing?:  No Does the patient have difficulty doing errands alone such as visiting a doctor's office or shopping?: No    Assessment & Plan  1. Morbid obesity  Discussed with the patient the risk posed by an increased BMI. Discussed importance of portion control, calorie counting and at least 150 minutes of physical activity weekly. Avoid sweet beverages and drink more water. Eat at least 6 servings of fruit and vegetables daily    2. Moderate episode of recurrent major depressive disorder  Keep follow up with Dr. Elna Breslow   3. Vitamin D deficiency  - Vitamin D, Ergocalciferol, (DRISDOL) 1.25 MG (50000 UNIT) CAPS capsule; Take 1 capsule (50,000 Units total) by mouth every 7 (seven) days. Please take every Thursday starting 01/17/2019  Dispense: 12 capsule; Refill: 1  4. GAD (generalized anxiety disorder)   5. Asthma moderate persistent and controlled    6. Iron deficiency anemia due to chronic blood loss  - CBC with Differential/Platelet - Iron, TIBC and Ferritin Panel

## 2022-08-05 ENCOUNTER — Encounter: Payer: Self-pay | Admitting: Family Medicine

## 2022-08-05 ENCOUNTER — Ambulatory Visit: Payer: BC Managed Care – PPO | Admitting: Family Medicine

## 2022-08-05 DIAGNOSIS — E559 Vitamin D deficiency, unspecified: Secondary | ICD-10-CM

## 2022-08-05 DIAGNOSIS — J4540 Moderate persistent asthma, uncomplicated: Secondary | ICD-10-CM

## 2022-08-05 DIAGNOSIS — D500 Iron deficiency anemia secondary to blood loss (chronic): Secondary | ICD-10-CM

## 2022-08-05 DIAGNOSIS — F331 Major depressive disorder, recurrent, moderate: Secondary | ICD-10-CM | POA: Diagnosis not present

## 2022-08-05 DIAGNOSIS — F411 Generalized anxiety disorder: Secondary | ICD-10-CM

## 2022-08-05 MED ORDER — VITAMIN D (ERGOCALCIFEROL) 1.25 MG (50000 UNIT) PO CAPS: 50000.0000 [IU] | ORAL_CAPSULE | ORAL | 1 refills | Status: DC

## 2022-08-06 LAB — CBC WITH DIFFERENTIAL/PLATELET
Absolute Monocytes: 495 cells/uL (ref 200–950)
Basophils Absolute: 41 cells/uL (ref 0–200)
Basophils Relative: 0.8 %
Eosinophils Absolute: 189 cells/uL (ref 15–500)
Eosinophils Relative: 3.7 %
HCT: 36.4 % (ref 35.0–45.0)
Hemoglobin: 11.1 g/dL — ABNORMAL LOW (ref 11.7–15.5)
Lymphs Abs: 1851 cells/uL (ref 850–3900)
MCH: 23.1 pg — ABNORMAL LOW (ref 27.0–33.0)
MCHC: 30.5 g/dL — ABNORMAL LOW (ref 32.0–36.0)
MCV: 75.7 fL — ABNORMAL LOW (ref 80.0–100.0)
MPV: 12.2 fL (ref 7.5–12.5)
Monocytes Relative: 9.7 %
Neutro Abs: 2525 cells/uL (ref 1500–7800)
Neutrophils Relative %: 49.5 %
Platelets: 291 10*3/uL (ref 140–400)
RBC: 4.81 10*6/uL (ref 3.80–5.10)
RDW: 15.7 % — ABNORMAL HIGH (ref 11.0–15.0)
Total Lymphocyte: 36.3 %
WBC: 5.1 10*3/uL (ref 3.8–10.8)

## 2022-08-06 LAB — IRON,TIBC AND FERRITIN PANEL
%SAT: 49 % (calc) — ABNORMAL HIGH (ref 15–45)
Ferritin: 8 ng/mL — ABNORMAL LOW (ref 16–154)
Iron: 188 ug/dL — ABNORMAL HIGH (ref 27–164)
TIBC: 384 mcg/dL (calc) (ref 271–448)

## 2022-08-19 ENCOUNTER — Other Ambulatory Visit: Payer: Self-pay | Admitting: Psychiatry

## 2022-08-19 DIAGNOSIS — F411 Generalized anxiety disorder: Secondary | ICD-10-CM

## 2022-09-11 ENCOUNTER — Other Ambulatory Visit: Payer: Self-pay | Admitting: Psychiatry

## 2022-09-11 DIAGNOSIS — F331 Major depressive disorder, recurrent, moderate: Secondary | ICD-10-CM

## 2022-09-14 ENCOUNTER — Ambulatory Visit: Payer: BC Managed Care – PPO | Admitting: Psychiatry

## 2022-09-14 ENCOUNTER — Encounter: Payer: Self-pay | Admitting: Psychiatry

## 2022-09-14 VITALS — BP 134/80 | HR 91 | Temp 97.4°F | Ht 70.0 in | Wt 302.8 lb

## 2022-09-14 DIAGNOSIS — F3341 Major depressive disorder, recurrent, in partial remission: Secondary | ICD-10-CM

## 2022-09-14 DIAGNOSIS — R41840 Attention and concentration deficit: Secondary | ICD-10-CM | POA: Diagnosis not present

## 2022-09-14 DIAGNOSIS — F331 Major depressive disorder, recurrent, moderate: Secondary | ICD-10-CM

## 2022-09-14 DIAGNOSIS — F411 Generalized anxiety disorder: Secondary | ICD-10-CM | POA: Diagnosis not present

## 2022-09-14 MED ORDER — DULOXETINE HCL 60 MG PO CPEP: 60.0000 mg | ORAL_CAPSULE | Freq: Every day | ORAL | 0 refills | Status: DC

## 2022-09-14 MED ORDER — PRAZOSIN HCL 1 MG PO CAPS: ORAL_CAPSULE | ORAL | 0 refills | Status: DC

## 2022-09-14 MED ORDER — DULOXETINE HCL 20 MG PO CPEP: 20.0000 mg | ORAL_CAPSULE | Freq: Every day | ORAL | 0 refills | Status: DC

## 2022-09-14 NOTE — Progress Notes (Unsigned)
[facility] MD OP Progress Note  09/14/2022 1:42 PM Michelle Richardson  MRN:  [id number]  Chief Complaint:  Chief Complaint  Patient presents with   Follow-up   Anxiety   Depression   HPI: Michelle Richardson is a 22 year old African-American female, lives in [place], currently works at [employer], has a history of MDD, GAD, attention and focus deficit was evaluated in office today.  Patient today reports she currently struggles with the fact that her dad is currently struggling with health problems which may be terminal.  That does worry her.  She however reports she never had a great relationship with her dad.  Her dad is currently not making the right choices in his life and that also worries her.  Patient reports when she does have situational stresses she does feel sad and anxious which can sometimes get prolonged.  She however overall has been doing well on the current dosage of Cymbalta.  She does have nightmares on and off however she has not had any significant struggles with sleep due to the nightmares.  She however continues to not have a good sleep hygiene.  She reports especially the time that she works night shifts she does not get enough sleep during the day since she stopped taking other chores around the house as well as her baby sister.  Patient does have prazosin available which she has been compliant on.  Patient denies any suicidality, homicidality or perceptual disturbances.  Patient is motivated to establish care with a therapist.  Denies any other concerns today.  Visit Diagnosis:    ICD-10-CM   1. Moderate episode of recurrent major depressive disorder (HCC)  F33.1     2. Generalized anxiety disorder  F41.1     3. Attention and concentration deficit  R41.840       Past Psychiatric History: I have reviewed psychiatric history from progress note on 03/30/2022.  Past Medical History:  Past Medical History:  Diagnosis Date   Asthma    Pediatric obesity    Suicide ideation 01/11/2019     Past Surgical History:  Procedure Laterality Date   TONSILLECTOMY AND ADENOIDECTOMY  08/02/2008    Family Psychiatric History: I have reviewed family psychiatric history from progress note on 03/30/2022.  Family History:  Family History  Problem Relation Age of Onset   Hypertension Mother    Drug abuse Father    Autism spectrum disorder Sister     Social History: I have reviewed social history from progress note on 03/30/2022. Social History   Socioeconomic History   Marital status: Single    Spouse name: Not on file   Number of children: 0   Years of education: Not on file   Highest education level: High school graduate  Occupational History   Not on file  Tobacco Use   Smoking status: Never   Smokeless tobacco: Never  Vaping Use   Vaping Use: Never used  Substance and Sexual Activity   Alcohol use: Never   Drug use: Never   Sexual activity: Not Currently    Birth control/protection: Abstinence  Other Topics Concern   Not on file  Social History Narrative   Not on file   Social Determinants of Health   Financial Resource Strain: Low Risk  (01/05/2022)   Overall Financial Resource Strain (CARDIA)    Difficulty of Paying Living Expenses: Not hard at all  Food Insecurity: No Food Insecurity (02/08/2022)   Hunger Vital Sign    Worried About Running Out of Food  in the Last Year: Never true    Ran Out of Food in the Last Year: Never true  Transportation Needs: No Transportation Needs (02/08/2022)   PRAPARE - Administrator, Civil Service (Medical): No    Lack of Transportation (Non-Medical): No  Physical Activity: Sufficiently Active (02/08/2022)   Exercise Vital Sign    Days of Exercise per Week: 5 days    Minutes of Exercise per Session: 80 min  Stress: Stress Concern Present (02/08/2022)   [employer] of Occupational Health - Occupational Stress Questionnaire    Feeling of Stress : Very much  Social Connections: Moderately Integrated  (02/08/2022)   Social Connection and Isolation Panel [NHANES]    Frequency of Communication with Friends and Family: More than three times a week    Frequency of Social Gatherings with Friends and Family: Twice a week    Attends Religious Services: More than 4 times per year    Active Member of [employer] or Organizations: Yes    Attends Banker Meetings: Never    Marital Status: Never married    Allergies: No Known Allergies  Metabolic Disorder Labs: Lab Results  Component Value Date   HGBA1C 5.8 (H) 01/06/2022   MPG 120 01/06/2022   MPG 126 01/11/2019   Lab Results  Component Value Date   PROLACTIN 11.1 01/06/2022   PROLACTIN 24.1 (H) 01/11/2019   Lab Results  Component Value Date   CHOL 153 01/06/2022   TRIG 66 01/06/2022   HDL 58 01/06/2022   CHOLHDL 2.6 01/06/2022   VLDL 12 01/11/2019   LDLCALC 80 01/06/2022   LDLCALC 91 01/11/2019   Lab Results  Component Value Date   TSH 0.73 01/06/2022   TSH 0.863 01/11/2019    Therapeutic Level Labs: No results found for: "LITHIUM" No results found for: "VALPROATE" No results found for: "CBMZ"  Current Medications: Current Outpatient Medications  Medication Sig Dispense Refill   DULoxetine (CYMBALTA) 20 MG capsule TAKE 1 CAPSULE (20 MG TOTAL) BY MOUTH DAILY. TAKE ALONG WITH 60 MG DAILY - TOTAL OF 80 MG DAILY 90 capsule 0   DULoxetine (CYMBALTA) 60 MG capsule TAKE 1 CAPSULE BY MOUTH EVERY DAY 90 capsule 0   fluticasone-salmeterol (WIXELA INHUB) 250-50 MCG/ACT AEPB INHALE 1 PUFF INTO THE LUNGS IN THE MORNING AND AT BEDTIME. 180 each 3   prazosin (MINIPRESS) 1 MG capsule TAKE 1 CAPSULE BY MOUTH EVERYDAY AT BEDTIME 30 capsule 0   propranolol (INDERAL) 10 MG tablet TAKE 1 TABLET (10 MG TOTAL) BY MOUTH 2 (TWO) TIMES DAILY AS NEEDED. FOR ANXIETY 180 tablet 0   tirzepatide (ZEPBOUND) 2.5 MG/0.5ML Pen Inject 2.5 mg into the skin once a week. 2 mL 0   Vitamin D, Ergocalciferol, (DRISDOL) 1.25 MG (50000 UNIT) CAPS capsule  Take 1 capsule (50,000 Units total) by mouth every 7 (seven) days. Please take every Thursday starting 01/17/2019 12 capsule 1   No current facility-administered medications for this visit.     Musculoskeletal: Strength & Muscle Tone: within normal limits Gait & Station: normal Patient leans: N/A  Psychiatric Specialty Exam: Review of Systems  Psychiatric/Behavioral:  The patient is nervous/anxious.   All other systems reviewed and are negative.   Blood pressure 134/80, pulse 91, temperature (!) 97.4 F (36.3 C), temperature source Skin, height 5\' 10"  (1.778 m), weight (!) 302 lb 12.8 oz (137.3 kg).Body mass index is 43.45 kg/m.  General Appearance: Casual  Eye Contact:  Fair  Speech:  Clear and Coherent  Volume:  Normal  Mood:  Anxious  Affect:  Congruent  Thought Process:  Goal Directed and Descriptions of Associations: Intact  Orientation:  Full (Time, Place, and Person)  Thought Content: Logical   Suicidal Thoughts:  No  Homicidal Thoughts:  No  Memory:  Immediate;   Fair Recent;   Fair Remote;   Fair  Judgement:  Fair  Insight:  Fair  Psychomotor Activity:  Normal  Concentration:  Concentration: Fair and Attention Span: Fair  Recall:  [employer] of Knowledge: Fair  Language: Fair  Akathisia:  No  Handed:  Right  AIMS (if indicated): not done  Assets:  Communication Skills Desire for Improvement Housing Social Support  ADL's:  Intact  Cognition: WNL  Sleep:  Fair   Screenings: AIMS    Flowsheet Row [facility]  AIMS Total Score 0      GAD-7    Flowsheet Row Office Visit from 08/05/2022 in [facility] Office Visit from 05/20/2022 in [facility] Counselor from 05/17/2022 in [facility] Office Visit from 05/05/2022 in [facility] Counselor from  04/15/2022 in [facility]  Total GAD-7 Score 11 12 18 13 20       PHQ2-9    Flowsheet Row Office Visit from 08/05/2022 in [facility] Office Visit from 05/20/2022 in [facility] Counselor from 05/17/2022 in [facility] Office Visit from 05/05/2022 in [facility] Counselor from 04/15/2022 in [facility]  PHQ-2 Total Score 4 3 4 3 4   PHQ-9 Total Score 16 13 17 18 22       Flowsheet Row Office Visit from 05/20/2022 in [facility] Counselor from 05/17/2022 in [facility] Counselor from 04/15/2022 in [facility]  C-SSRS RISK CATEGORY Low Risk No Risk No Risk        Assessment and Plan: Michelle Richardson is a 22 year old African-American female with history of depression, anxiety was evaluated in office today.  Patient did multiple situational stresses will benefit from psychotherapy sessions, will continue medications as noted below.  Plan MDD-improving Cymbalta 80 mg p.o. daily Prazosin 1 mg p.o. nightly for nightmares Patient to establish care with a therapist.  GAD-improving Patient provided resources to establish care with therapist Propranolol 10 mg p.o. twice daily as needed Cymbalta 80 mg p.o. daily  Attention and concentration deficit-patient has upcoming appointment with Dr. Kieth Brightly.  Follow-up in clinic in 3 months or sooner if needed.     Collaboration of Care: Collaboration of Care: Referral or follow-up with counselor/therapist AEB patient encouraged to establish care with therapist.  Patient/Guardian was advised Release of Information must be obtained prior to any record release in order to collaborate their care with an outside provider.  Patient/Guardian was advised if they have not already done so to contact the registration department to sign all necessary forms in order for [place] to release information regarding their care.   Consent: Patient/Guardian gives verbal consent for treatment and assignment of benefits for services provided during this visit. Patient/Guardian expressed understanding and agreed to proceed.   This note was generated in part or whole with voice recognition software. Voice recognition is usually quite accurate but there are transcription errors that can and very  often do occur. I apologize for any typographical errors that were not detected and corrected.    Jomarie Longs, MD 09/14/2022, 1:42 PM

## 2022-09-14 NOTE — Patient Instructions (Addendum)
[email or web address].org  www.psychologytoday  [employer] [email or web address] [address] [place], [place] [address]   [phone]  [facility], [facility] [email or web address] [address], [place], [place] [address]   [phone]   [facility] - [phone]  [facility] - [phone]  [facility] - [phone] counseling [phone]  [facility] [phone]   [facility] this clinician can offer telehealth and has a sliding scale option  https://clark-gentry.info/ this group also offers sliding scale rates and is based out of [place]   [employer] [facility] has interns who offer sliding scale rates and some of the full time clinicians do, as well. You complete their contact form on their website and the referrals coordinator will help to get connected to someone Insomnia Insomnia is a sleep disorder that makes it difficult to fall asleep or stay asleep. Insomnia can cause fatigue, low energy, difficulty concentrating, mood swings, and poor performance at work or school. There are three different ways to classify insomnia: Difficulty falling asleep. Difficulty staying asleep. Waking up too early in the morning. Any type of insomnia can be G[place]-term (chronic) or short-term (acute). Both are common. Short-term insomnia usually lasts for 3 months or less. Chronic insomnia occurs at least three times a week for longer than 3 months. What are the causes? Insomnia may be caused by another condition, situation, or substance, such as: Having certain mental health conditions, such as anxiety and depression. Using caffeine, alcohol, tobacco, or drugs. Having gastrointestinal conditions, such as gastroesophageal reflux disease (GERD). Having certain medical conditions. These  include: Asthma. Alzheimer's disease. Stroke. Chronic pain. An overactive thyroid gland (hyperthyroidism). Other sleep disorders, such as restless legs syndrome and sleep apnea. Menopause. Sometimes, the cause of insomnia may not be known. What increases the risk? Risk factors for insomnia include: Gender. Females are affected more often than males. Age. Insomnia is more common as people get older. Stress and certain medical and mental health conditions. Lack of exercise. Having an irregular work schedule. This may include working night shifts and traveling between different time zones. What are the signs or symptoms? If you have insomnia, the main symptom is having trouble falling asleep or having trouble staying asleep. This may lead to other symptoms, such as: Feeling tired or having low energy. Feeling nervous about going to sleep. Not feeling rested in the morning. Having trouble concentrating. Feeling irritable, anxious, or depressed. How is this diagnosed? This condition may be diagnosed based on: Your symptoms and medical history. Your health care provider may ask about: Your sleep habits. Any medical conditions you have. Your mental health. A physical exam. How is this treated? Treatment for insomnia depends on the cause. Treatment may focus on treating an underlying condition that is causing the insomnia. Treatment may also include: Medicines to help you sleep. Counseling or therapy. Lifestyle adjustments to help you sleep better. Follow these instructions at home: Eating and drinking  Limit or avoid alcohol, caffeinated beverages, and products that contain nicotine and tobacco, especially close to bedtime. These can disrupt your sleep. Do not eat a large meal or eat spicy foods right before bedtime. This can lead to digestive discomfort that can make it hard for you to sleep. Sleep habits  Keep a sleep diary to help you and your health care provider figure out  what could be causing your insomnia. Write down: When you sleep. When you wake up  during the night. How well you sleep and how rested you feel the next day. Any side effects of medicines you are taking. What you eat and drink. Make your bedroom a dark, comfortable place where it is easy to fall asleep. Put up shades or blackout curtains to block light from outside. Use a white noise machine to block noise. Keep the temperature cool. Limit screen use before bedtime. This includes: Not watching TV. Not using your smartphone, tablet, or computer. Stick to a routine that includes going to bed and waking up at the same times every day and night. This can help you fall asleep faster. Consider making a quiet activity, such as reading, part of your nighttime routine. Try to avoid taking naps during the day so that you sleep better at night. Get out of bed if you are still awake after 15 minutes of trying to sleep. Keep the lights down, but try reading or doing a quiet activity. When you feel sleepy, go back to bed. General instructions Take over-the-counter and prescription medicines only as told by your health care provider. Exercise regularly as told by your health care provider. However, avoid exercising in the hours right before bedtime. Use relaxation techniques to manage stress. Ask your health care provider to suggest some techniques that may work well for you. These may include: Breathing exercises. Routines to release muscle tension. Visualizing peaceful scenes. Make sure that you drive carefully. Do not drive if you feel very sleepy. Keep all follow-up visits. This is important. Contact a health care provider if: You are tired throughout the day. You have trouble in your daily routine due to sleepiness. You continue to have sleep problems, or your sleep problems get worse. Get help right away if: You have thoughts about hurting yourself or someone else. Get help right away if you  feel like you may hurt yourself or others, or have thoughts about taking your own life. Go to your nearest emergency room or: Call 911. Call the National Suicide Prevention Lifeline at 256-525-3350 or 988. This is open 24 hours a day. Text the Crisis Text Line at 320-193-2704. Summary Insomnia is a sleep disorder that makes it difficult to fall asleep or stay asleep. Insomnia can be Udell-term (chronic) or short-term (acute). Treatment for insomnia depends on the cause. Treatment may focus on treating an underlying condition that is causing the insomnia. Keep a sleep diary to help you and your health care provider figure out what could be causing your insomnia. This information is not intended to replace advice given to you by your health care provider. Make sure you discuss any questions you have with your health care provider. Document Revised: 03/22/2021 Document Reviewed: 03/22/2021 Elsevier Patient Education  2023 [employer].

## 2022-10-13 ENCOUNTER — Encounter: Payer: Self-pay | Admitting: Psychology

## 2022-10-13 ENCOUNTER — Encounter: Payer: BC Managed Care – PPO | Attending: Psychology | Admitting: Psychology

## 2022-10-13 DIAGNOSIS — F411 Generalized anxiety disorder: Secondary | ICD-10-CM | POA: Diagnosis present

## 2022-10-13 DIAGNOSIS — F331 Major depressive disorder, recurrent, moderate: Secondary | ICD-10-CM | POA: Insufficient documentation

## 2022-10-13 DIAGNOSIS — R41840 Attention and concentration deficit: Secondary | ICD-10-CM | POA: Insufficient documentation

## 2022-10-13 NOTE — Progress Notes (Signed)
Neuropsychological Consultation   Patient:   Michelle Richardson   DOB:   2000-12-08  MR Number:  [id number]  Location:  [facility] MEDICINE [facility] PHYSICAL MEDICINE & REHABILITATION [address] [place], [place] [address] [id number] [facility] [place] [address] Dept: [phone]           Date of Service:   10/13/2022  Location of Service and Individuals present: Today's visit was conducted in my outpatient clinic office with the patient and myself present.  Start Time:   9 AM End Time:   11 AM  Patient Consent and Confidentiality: Patient consent for the visit as well as follow-up neuropsychological assessment.  Limits of confidentiality reviewed including the request for neuropsychological evaluation to be conducted which would be made available to her referring psychiatrist and also be made available in the patient's electronic medical records.  Patient consents for the evaluation and records to be disseminated to appropriate medical professionals.  Consent for Evaluation and Treatment:  Signed:  Yes Explanation of Privacy Policies:  Signed:  Yes Discussion of Confidentiality Limits:  Yes  Provider/Observer:  Arley Phenix, Psy.D.       Clinical Neuropsychologist       Billing Code/Service: 96116/96121  Chief Complaint:     Chief Complaint  Patient presents with   Anxiety   Depression   Other    Attention and concentration difficulties     Reason for Service:    Michelle Richardson is a 22 year old female referred for neuropsychological evaluation by her treating psychiatrist Jomarie Longs, MD due to ongoing issues with attention and concentration to aid in differential diagnostic considerations with the past medical/psychiatric history of anxiety and depression.  The patient has previously been diagnosed with major depressive disorder as well as generalized anxiety disorder along with attention and concentration deficits.  Patient has a past medical history  including intermittent and poorly controlled asthma, morbid obesity and vitamin D deficiency.  Patient continues to have issues related to sadness/depression and anxiety with some acute psychosocial stressors.  Patient has a history of being bullied from kindergarten through high school as well as a history of issues with attention and concentration beginning in early elementary school at least.  Patient reports very erratic sleep schedule and describes her sleep pattern is "all over the place."  The patient reports that she typically gets between 3 and 5 hours of sleep and does have some third shift work that resulted in her not getting enough sleep as she has lots of things to do during the day.    The patient does have recurrent nightmares at times but denies that they are negatively impacting her sleep duration.  She describes good hygiene.  Patient does admit to snoring but does not report that she feels fatigued or somnolent during the day.  I did suggest she talk with her PCP about home sleep study just to rule out those issues.  The patient talks about her anxiety and depression symptoms that were associated with stressors in early childhood relating to her parents divorcing and being bullied in school starting as young as kindergarten.  The patient reports that she also had attentional issues that she was not fully aware of but was aware that she had a very prominent feature of daydreaming in school and was distracted from her active schoolwork.  This was noted by her mother who make sure the teachers were aware and efforts were made to redirect her attention during school.  Patient  was never diagnosed or treated for attention deficit disorder as a child and has never taken any type of psychostimulant medications.  Current psychotropic medications include Cymbalta which she describes as tolerating well with no apparent side effects.  Patient is also taking prazosin to help with some of her anxiety  symptoms.  Patient continues to have some current stressors including her father's medical status.  The patient did have an inpatient hospitalization when she was a young teenager in [facility] due to depression.  This occurred when she was 22 years old and in the 10th grade.  The patient had a precipitous fall in her grades into the range of just passing from being on the AB honor roll due to worsening symptoms of depression and sleeping more than usual and not being able to participate in school activities.  The patient was admitted for worsening depression with suicidal thoughts with plan of killing herself with no specific plan noted.  Patient is struggled with remote learning in school during the COVID pandemic era and the patient was stressed with the separation and divorce of her parents which was very traumatic for her.  She was only having occasional contact with her father and only communicating by phone.  She had had previous episodes of depression in school and had engaged in limited self-harm behavior.  Patient reports that she feels like much of her depression and anxiety revert back to experience that she had in childhood and the way she grew up in general.  She describes being actively bullied from kindergarten through 12th grade and struggling to figure out her life currently.  Patient reports that she is having a lot of trouble now with her anxiety most of the time.  When asked about specific attentional issues patient describes difficulties with focusing on 1 thing at a time and reports that it feels normal for her to have her brain always running and thinking about various aspects.  The patient reports that her daydreams both in school and still to this they tend to be around imagining what will be happening in the future with regard to her activities and working through different scenarios in her mind about future activities.  It is unclear objectively whether this  daydreaming is more of a coping mechanism to avoid things or redirect stressful aspects of her current life or more of an attentional issue with inability to stay focused.  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  There were some concerns about potential neurological findings and the patient had a ultrasound head performed 2 days after she was born.  There were no indications of any acute abnormalities and no evidence of periventricular leukomalacia being suggested with this ultrasound.  he patient was born in [facility] [place] and grew up with 4 siblings.  There were some challenges during her mother's pregnancy with her and the patient was born at 31 weeks and weighed 3 pounds 12 ounces at birth.  There were some concerns about possible neurological issues related to her early birth but ultrasound studies at that time were not indicative of any issues.  The patient currently lives with her mother is and has lived with her mother.  The patient's parents divorced when she was very young.  The patient has a sister who has been diagnosed with autism and another sister and brother has been diagnosed with pression.  The patient has no children.   Medical History:   Past  Medical History:  Diagnosis Date   Asthma    Pediatric obesity    Suicide ideation 01/11/2019         Patient Active Problem List   Diagnosis Date Noted   Attention and concentration deficit 03/31/2022   Morbid obesity (HCC) 01/05/2022   Vitamin D deficiency 01/05/2022   Asthma, mild intermittent, poorly controlled 01/05/2022   GAD (generalized anxiety disorder) 04/16/2019   Moderate episode of recurrent major depressive disorder (HCC) 04/16/2019    Onset and Duration of Symptoms: Symptoms of anxiety depression and attentional issues go back to early childhood but there are also a lot of psychosocial stressors going on at that time as well.  Progression of Symptoms: Patient reports that she  feels like her anxiety has been worse with some of that is related to current psychosocial stressors such as her father being very ill medically and starting professional job but having increased adult responsibilities.  Additional Tests and Measures from other records:  Neuroimaging Results: No previous neuroimaging studies conducted besides had ultrasound performed 2 days after birth.  There were no acute or other abnormal findings noted with the study.  Behavioral Observation/Mental Status:   Michelle Richardson  presents as a 22 y.o.-year-old Right handed African American Female who appeared her stated age. her dress was Appropriate and she was Well Groomed and her manners were Appropriate to the situation.  her participation was indicative of Appropriate behaviors.  There were not physical disabilities noted.  she displayed an appropriate level of cooperation and motivation.    Interactions:    Active Appropriate  Attention:   within normal limits and attention span and concentration were age appropriate  Memory:   within normal limits; recent and remote memory intact  Visuo-spatial:   not examined  Speech (Volume):  normal  Speech:   normal; normal  Thought Process:  Coherent and Relevant  Coherent, Directed, and Logical  Though Content:  WNL; not suicidal and not homicidal  Orientation:   person, place, time/date, and situation  Judgment:   Good  Planning:   Good  Affect:    Anxious  Mood:    Anxious  Insight:   Good  Intelligence:   normal  Marital Status/Living:  The patient was born in [facility] [place] and grew up with 4 siblings.  There were some challenges during her mother's pregnancy with her and the patient was born at 31 weeks and weighed 3 pounds 12 ounces at birth.  There were some concerns about possible neurological issues related to her early birth but ultrasound studies at that time were not indicative of any issues.  The patient currently lives with her  mother is and has lived with her mother.  The patient's parents divorced when she was very young.  The patient has a sister who has been diagnosed with autism and another sister and brother has been diagnosed with pression.  The patient has no children.  Educational and Occupational History:     Highest Level of Education:   Patient graduated from high school and had generally see average throughout school.  Her best subject in school is art and she had relative struggles in science and history type classes.  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  Patient received the CTE metal awarded in high school.  Current Occupation:    Patient currently works with [employer] and does some third shift jobs.  Hobbies  and Interests: The patient loves painting, reading, knitting, crocheting and sleeping.   Psychiatric History: Patient with history of major depressive disorder and anxiety disorder along with her attentional issues.  Patient denies severe or tickly abusive traumatic experiences but does describe a history of being bullied throughout her academic experiences.   Past Psychiatric Treatments: Anxiousness and Depressed  History of Substance Use or Abuse:  No concerns of substance abuse are reported.    Mental Health Hospitalizations:   Patient was hospitalized on the behavioral health inpatient unit in her mid teenage years.  This was primarily due to depression.  Family Med/Psych History:  Family History  Problem Relation Age of Onset   Hypertension Mother    Drug abuse Father    Autism spectrum disorder Sister     Impression/DX:   Michelle Richardson is a 22 year old female referred for neuropsychological evaluation by her treating psychiatrist Jomarie Longs, MD due to ongoing issues with attention and concentration to aid in differential diagnostic considerations with the past medical/psychiatric history  of anxiety and depression.  The patient has previously been diagnosed with major depressive disorder as well as generalized anxiety disorder along with attention and concentration deficits.  Patient has a past medical history including intermittent and poorly controlled asthma, morbid obesity and vitamin D deficiency.  Patient continues to have issues related to sadness/depression and anxiety with some acute psychosocial stressors.  Patient has a history of being bullied from kindergarten through high school as well as a history of issues with attention and concentration beginning in early elementary school at least.  Patient reports very erratic sleep schedule and describes her sleep pattern is "all over the place."  The patient reports that she typically gets between 3 and 5 hours of sleep and does have some third shift work that resulted in her not getting enough sleep as she has lots of things to do during the day.    The patient does have recurrent nightmares at times but denies that they are negatively impacting her sleep duration.  She describes good hygiene.  Patient does admit to snoring but does not report that she feels fatigued or somnolent during the day.  I did suggest she talk with her PCP about home sleep study just to rule out those issues.  Disposition/Plan:  We have set the patient up for formal neuropsychological assessment and she will complete the comprehensive attention battery and the CAB CPT measures.  This objective data will combined with available medical records and subjective reports of her historic and current status with formal report to be produced with diagnostic considerations and treatment recommendations being provided to the patient's referring psychiatrist as well as a feedback session set up with the patient to go over the results and specific individual recommendations to the patient herself made during that time.  Diagnosis:    Attention and concentration  deficit  Moderate episode of recurrent major depressive disorder (HCC)  GAD (generalized anxiety disorder)        Note: This document was prepared using Dragon voice recognition software and may include unintentional dictation errors.   Electronically Signed   _______________________ Arley Phenix, Psy.D. Clinical Neuropsychologist

## 2022-11-08 ENCOUNTER — Encounter: Payer: BC Managed Care – PPO | Attending: Psychology

## 2022-11-08 DIAGNOSIS — F411 Generalized anxiety disorder: Secondary | ICD-10-CM

## 2022-11-08 DIAGNOSIS — R41840 Attention and concentration deficit: Secondary | ICD-10-CM | POA: Diagnosis present

## 2022-11-08 DIAGNOSIS — F331 Major depressive disorder, recurrent, moderate: Secondary | ICD-10-CM | POA: Diagnosis present

## 2022-11-08 DIAGNOSIS — F5101 Primary insomnia: Secondary | ICD-10-CM | POA: Diagnosis present

## 2022-11-08 NOTE — Progress Notes (Signed)
   Behavioral Observations:  The patient appeared well-groomed and appropriately dressed. Her manners were polite and appropriate to the situation. The patient's attitude towards testing was positive and her effort was good.   Neuropsychology Note  Keymani Ogletree completed 80 minutes of neuropsychological testing with technician, Staci Acosta, BA, under the supervision of Arley Phenix, PsyD., Clinical Neuropsychologist. The patient did not appear overtly distressed by the testing session, per behavioral observation or via self-report to the technician. Rest breaks were offered.   Clinical Decision Making: In considering the patient's current level of functioning, level of presumed impairment, nature of symptoms, emotional and behavioral responses during clinical interview, level of literacy, and observed level of motivation/effort, a battery of tests was selected by Dr. Kieth Brightly during initial consultation on 10/13/2022. This was communicated to the technician. Communication between the neuropsychologist and technician was ongoing throughout the testing session and changes were made as deemed necessary based on patient performance on testing, technician observations and additional pertinent factors such as those listed above.  Tests Administered: Comprehensive Attention Battery (CAB) Continuous Performance Test (CPT)   Results: Will be included in final report   Feedback to Patient: Marykate Pauwels will return on 09/04/2023 for an interactive feedback session with Dr. Kieth Brightly at which time her test performances, clinical impressions and treatment recommendations will be reviewed in detail. The patient understands she can contact our office should she require our assistance before this time.  80 minutes spent face-to-face with patient administering standardized tests, 30 minutes spent scoring Radiographer, therapeutic). [CPT [id number], [id number]]  Full report to follow.

## 2022-11-15 ENCOUNTER — Encounter: Payer: Self-pay | Admitting: Psychology

## 2022-11-15 ENCOUNTER — Encounter (HOSPITAL_BASED_OUTPATIENT_CLINIC_OR_DEPARTMENT_OTHER): Payer: BC Managed Care – PPO | Admitting: Psychology

## 2022-11-15 DIAGNOSIS — F331 Major depressive disorder, recurrent, moderate: Secondary | ICD-10-CM | POA: Diagnosis not present

## 2022-11-15 DIAGNOSIS — F5101 Primary insomnia: Secondary | ICD-10-CM

## 2022-11-15 DIAGNOSIS — F411 Generalized anxiety disorder: Secondary | ICD-10-CM | POA: Diagnosis not present

## 2022-11-15 DIAGNOSIS — R41840 Attention and concentration deficit: Secondary | ICD-10-CM | POA: Diagnosis not present

## 2022-11-15 NOTE — Progress Notes (Unsigned)
Neuropsychological Evaluation   Patient:  Michelle Richardson   DOB: 2000/10/12  MR Number: [id number]  Location: [facility] [address] [place], [address] [place] [address] Dept: [phone]  Start: 9 AM End: 10 AM  Provider/Observer:     Hershal Coria PsyD  Chief Complaint:      Chief Complaint  Patient presents with   Anxiety   Depression   Other    Attention and Concentration Difficulties    Reason For Service:      Michelle Richardson is a 22 year old female referred for neuropsychological evaluation by her treating psychiatrist Michelle Longs, MD, due to ongoing issues with attention and concentration in order to aid in differential diagnostic considerations.  The patient has previously been diagnosed with major depressive disorder as well as generalized anxiety disorder along with attention and concentration deficits.  Patient has a past medical history including intermittent and poorly controlled asthma, morbid obesity and vitamin D deficiency.  Patient continues to have issues related to sadness/depression and anxiety with some acute psychosocial stressors.  Patient has a history of being bullied from kindergarten through high school as well as a history of issues with attention and concentration beginning in early elementary school at least.  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    The patient reports recurrent nightmares at times but denies that they are negatively impacting her sleep duration.  She describes good sleep hygiene.  Patient admits to snoring but does not report that she feels fatigued or somnolent during the day.  I did  suggest she talk with her PCP about home sleep study just to rule out possible issues of obstructive sleep apnea.   The patient talks about her anxiety and depression symptoms that were associated with stressors in early childhood relating to her parents divorcing and being bullied in school starting as young as kindergarten.  The patient reports that she also had attentional issues that she was not fully aware of but was aware that she had a very prominent feature of daydreaming in school and was distracted from her active schoolwork.  This was noted by her mother, who made sure the teachers were aware and efforts were made to redirect her attention during school.  Patient was never diagnosed or treated for attention deficit disorder as a child and has never taken any type of psychostimulant medications.  Current psychotropic medications include Cymbalta, which she describes as tolerating well with no apparent side effects.  Patient is also taking prazosin to help with some of her anxiety symptoms.  Patient continues to have some current stressors including her father's medical status.  The patient did have an inpatient hospitalization when she was a young teenager in the [facility] due to depression.  This occurred when she was 22 years old and in the 10th grade.  The patient had a precipitous fall in her grades into the range of just passing from being on the [employer] honor roll due to worsening symptoms of depression and sleeping more than usual and not being able to participate in school activities.  The patient was admitted for worsening  depression with suicidal thoughts with plan of killing herself with no specific plan noted.  Patient has struggled with remote learning in school during the COVID pandemic era and the patient was stressed with the separation and divorce of her parents, which was very traumatic for her.  She was only having occasional contact with her father and only  communicating by phone.  She had previous episodes of depression in school and had engaged in limited self-harm behavior.   Patient reports that she feels like much of her depression and anxiety revert back to experience that she had in childhood and the way she grew up in general.  She describes being actively bullied from kindergarten through 12th grade and struggling to figure out her life currently.  Patient reports that she is having a lot of trouble now with her anxiety most of the time.   When asked about specific attentional issues, patient describes difficulties with focusing on 1 thing at a time and reports that it feels normal for her to have her brain always running and thinking about various aspects.  The patient reports that her daydreams both in school and other parts of her life and still to this day they tend to be around imagining what will be happening in the future with regard to her activities and working through different scenarios in her mind about future activities.  It is unclear objectively whether this daydreaming is more of a coping mechanism to avoid things or redirect stressful aspects of her current life or more of an attentional issue with inability to stay focused.   The patient was born premature at 41 weeks estimated gestational age and weighed 3 pounds 12 ounces at birth.  There were some concerns about potential neurological findings and the patient had a ultrasound head performed 2 days after she was born.  There were no indications of any acute abnormalities and no evidence of periventricular leukomalacia being suggested with this ultrasound.  The patient was born in [facility] [place] and grew up with 4 siblings.  The patient currently lives with her mother and continues to live with her mother.  The patient's parents divorced when she was very young.  The patient has a sister, who has been diagnosed with autism and another sister and brother has been diagnosed  with depression.  The patient has no children.     Medical History:                         Past Medical History:  Diagnosis Date   Asthma     Pediatric obesity     Suicide ideation 01/11/2019                                                               Patient Active Problem List    Diagnosis Date Noted   Attention and concentration deficit 03/31/2022   Morbid obesity (HCC) 01/05/2022   Vitamin D deficiency 01/05/2022   Asthma, mild intermittent, poorly controlled 01/05/2022   GAD (generalized anxiety disorder) 04/16/2019   Moderate episode of recurrent major depressive disorder (HCC) 04/16/2019      Onset and Duration of Symptoms: Symptoms of anxiety depression and attentional issues go back to early childhood but there are also  a lot of psychosocial stressors going on at that time as well.   Progression of Symptoms: Patient reports that she feels like her anxiety has been worse with some of that is related to current psychosocial stressors such as her father being very ill medically and starting professional job but having increased adult responsibilities.  Tests Administered: Comprehensive Attention Battery (CAB) Continuous Performance Test (CPT)  Participation Level:   Active  Participation Quality:  Appropriate      Behavioral Observation:  The patient appeared well-groomed and appropriately dressed. Her manners were polite and appropriate to the situation. The patient's attitude towards testing was positive and her effort was good.   Well Groomed, Alert, and Appropriate.   Summary of Results:   The patient was administered the comprehensive attention battery and the CAB continuous performance measures to provide an objective assessment of a wide range of attention and concentration and executive functioning in both auditory and visual domains.  Validity of these measures appear to be quite good as the patient fully participated throughout this assessment and embedded  validity measures including aspects of the pure reaction time tests and digit span measures were well within normative expectations.  Therefore this does appear to be a fair and valid assessment of her current attentional and executive functioning capacities.  Initially, the patient was administered the auditory and visual pure reaction time measures.  On the pure visual reaction time measure the patient correctly identified 47 of 50 targets with 3 errors of omission.  These 3 errors were at the very beginning of this measure and suggested some apprehension and adjustment to utilizing the touch screen interface which is a pressure sensitive device rather than electro static measure commonly used on smart phones.  The patient's average response time was 379 ms which is within normal limits.  On the pure auditory reaction time measure she correctly identified 48 of 50 targets which is within normative expectations and her average response time was 454 ms which is also within normative expectations.  The patient was then administered the discriminate reaction time measures which require quick decision making of whether to respond or not respond to target nontarget stimuli.  On the visual discriminate reaction time measure the patient correctly responded to 34 of 35 targets with no errors of commission and only 1 error of omission.  This is within normative expectations.  Average response time was 546 ms which was also within normative expectations.  On the auditory discriminate reaction time measure she correctly responded to 33 of 35 targets with no errors of commission and only 2 errors of omission.  This is also within normative expectations.  Her average response time was 862 ms which is somewhat slower than normative expectations performing 1.23 standard deviations outside of normative patterns.  On the shift discriminant reaction time the patient correctly responded to 28 of 30 targets with no errors of  commission and 2 errors of omission.  This is within normative expectations.  Average response time was 902 ms which was within normative expectations but continued to display slower auditory responding versus visual responding.  The patient was then administered the auditory visual scan reaction time measure.  On the visual scan reaction time measure the patient correctly responded to 37 of 40 targets with 3 errors of omission and performances within normative expectations.  Average response time was 699 ms which is also within normative expectations on the auditory scan reaction time measure she correctly responded to 37 of 40 targets  with no errors of commission and 3 errors of omission.  Average response time was 1332 ms which is within normative expectations as well as accuracy scores being within normative expectations.  On the mixed scan reaction time measure she correctly responded to 37 of 40 targets with 3 errors of omission and performed within normative expectations.  Average response time was 1102 ms which was within normative expectations.  On the auditory/visual encoding measures the patient performed quite well for both auditory and visual encoding components.  All of her measures were equal to or better than normative comparisons.  On the auditory encoding forwards and backwards measures the patient's performances were right at normative averages.  On the visual forwards and backwards the patient performed at the upper end of the normative range and on the visual backwards measures she performed 0.916 standard deviations better than her normative comparison group.  The patient was then administered the Stroop interference cancellation measures.  This measure starts out as a nontargeted interference focus execute measure requiring visual scanning, visual searching and assesses information processing speed.  After 4 9 interference trials this measure is immediately shifted into a targeted  interference component where the patient continues to do the exact same process they have been doing in the non-interference portion but has a targeted auditory interference applied.  The patient's performance on the first 4 non-interference trials was quite good and she performed at the upper end of the average range on measures of focus execute ability requiring visual scanning and visual searching in a rapid succession.  As this measure is immediately transitioned into a targeted interference challenge the patient continued with her excellent performance and showed no deterioration in performance throughout the V4 series and actually did better on the first interference trial than she did on the last 9 interference trial.  This is quite good and indicates the capacity to remain free from targeted external distractors.  Finally, the patient was administered the visual monitor CPT measure from the comprehensive attention battery.  This is a 15-minute visual discriminate reaction time measure that is broken down into five 3-minute blocks of time for analysis.  On the first 3 minutes of this measure the patient correctly responded to 23 of 30 targets with 7 errors of omission.  This is somewhat impaired score with average response time of 445 ms.  This pattern of numerous errors of omission continued throughout the 15 minutes of this task ranging from 7 missed items to 10 minutes items and was generally consistent across each of the 5 blocks of time assessed.  She showed a generally stable average response time initially but slowed during the last 3 minutes of this task.  She averaged between 445 ms and 570 ms during the first 12 minutes of this task in the last 3 minutes increased up to 677 ms.  While this pattern of slowed information processing speed at the very end but generally consistent errors of omission throughout are consistent with deficits in the domain of sustaining attention but are more typically  seen with overall fatigue and lethargy rather than what you would typically see with attention deficit disorder.  Typically, attention deficit disorder patterns are of efficient performance for the first 6 to 9 minutes of the task and then performance deteriorating towards the end of these continuous performance measures.  In this case, her performance started out with increased errors of omission that was maintained in a steady pattern throughout the 15-minute task and her precipitous  slowing of information processing speed did not occur until the very end and she maintained information processing speed consistently for the first 12 minutes of this task.  While this is indicative of deficits with regard to sustaining attention and does not typical of attention deficit disorder but more of a pattern consistent with what you might see with sleep deprivation.  Impression/Diagnosis:   Overall, the results of the current neuropsychological evaluation of a wide range of attention and concentration/executive functioning measures were consistent with the patient describing difficulties with attention and concentration.  However, the patient did very well on many aspects of attention including focus execute abilities, cognitive shifting, freedom from external distraction, auditory and visual encoding capacity and capacity to divide and shift attention.  There was only 1 area of attention and concentration that showed objective deficits and this had to do with her performance on the visual continual performance test.  Close analysis of the results show that the patient consistently showed elevated errors of omission that were generally consistent throughout this 15-minute task.  The patient maintained a steady information processing speed/reaction time throughout the first 12 minutes of this task but did show a slowing at the very end of this 15-minute measure.  This pattern of excellent attentional variables up until the  very end of our assessment and the specific pattern displayed on the continuous performance test is much more consistent with attentional deficits you might see due to fatigue and drowsiness developing rather than a pattern that is typically seen with attention deficit disorder.  The patient showed consistent problems starting at the very beginning of this 15-minute task and maintain the same pattern roughly throughout this measure rather than showing a progressive deterioration as a function of the evolution of performance with this CPT measure.  As far as diagnostic considerations, the patient clearly has significant issues with depression and anxiety which of been present throughout her life with significant psychosocial stressors throughout.  The patient has had major depressive events and adolescence that were driven by significant psychosocial stressors leading to inpatient hospitalization in adolescence.  At the current time, the patient continues with a number of psychosocial stressors and difficulties adjusting to and coping with these challenging life events.  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  This is clearly an inadequate amount of sleep and is likely playing a role in her attentional deficits and the objective assessment would be consistent with those types of patterns.  While the patient may initially experience an improvement in cognition with a psychostimulant I suspect it would be indirectly addressing aspects of insomnia and sleep insufficiency.  There is also the possibility of an obstructive sleep apnea which I have already encouraged the patient to address with her PCP to see if she could have a home sleep study to rule in or rule out this possibility.  I do suspect that the attention and  concentration issues are directly related to a combination of her recurrent major depressive events and significant anxiety in conjunction with her inadequate sleep patterns and may be exacerbated and worsened by obstructive sleep apnea.  The patient reports that she is responding well to her psychotropic medications without side effects or complications.  As far as treatment recommendations, I would encourage her to continue  to work on psychotropic efforts being performed by her treating psychiatrist and to look aggressively into issues of improving sleep overall and being evaluated for potential obstructive sleep apnea.  As this pattern of objective neuropsychological performance is not consistent with the pattern seen with adult residual attention deficit disorder and the patient actually performed quite well on all of the attentional measures with the exception of the CPT measure (CPT performance was more consistent with overall fatigue and somnolence rather than a progressive pattern of deterioration as a function of time) a primary focus should be on continuing to address her depression and anxiety.  I would suggest continuation or involvement with psychotherapeutic efforts as the patient has had a lot of negative reinforcement throughout her life and a lot of significant psychosocial issues that she continues to have difficulties coping and adjusting to.  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  I will sit down with the patient to go over the results of the current neuropsychological evaluation and make sure that a copy of this evaluation gets to referring psychiatrist Dr. Elna Breslow and made available in her EMR for other appropriate medical professionals including her PCP to have access  to.  Diagnosis:    Moderate episode of recurrent major depressive disorder (HCC)  GAD (generalized anxiety disorder)  Primary insomnia  Attention and concentration deficit   _____________________ Arley Phenix, Psy.D. Clinical Neuropsychologist

## 2022-11-20 ENCOUNTER — Other Ambulatory Visit: Payer: Self-pay | Admitting: Psychiatry

## 2022-11-20 DIAGNOSIS — F411 Generalized anxiety disorder: Secondary | ICD-10-CM

## 2022-12-20 ENCOUNTER — Ambulatory Visit: Payer: BC Managed Care – PPO | Admitting: Psychiatry

## 2022-12-20 ENCOUNTER — Encounter: Payer: Self-pay | Admitting: Psychiatry

## 2022-12-20 VITALS — BP 118/80 | HR 76 | Temp 97.7°F | Ht 70.0 in | Wt 307.4 lb

## 2022-12-20 DIAGNOSIS — F411 Generalized anxiety disorder: Secondary | ICD-10-CM | POA: Diagnosis not present

## 2022-12-20 DIAGNOSIS — F3342 Major depressive disorder, recurrent, in full remission: Secondary | ICD-10-CM | POA: Diagnosis not present

## 2022-12-20 DIAGNOSIS — R41840 Attention and concentration deficit: Secondary | ICD-10-CM

## 2022-12-20 DIAGNOSIS — G4700 Insomnia, unspecified: Secondary | ICD-10-CM | POA: Diagnosis not present

## 2022-12-20 MED ORDER — PRAZOSIN HCL 1 MG PO CAPS: ORAL_CAPSULE | ORAL | 0 refills | Status: DC

## 2022-12-20 MED ORDER — DULOXETINE HCL 20 MG PO CPEP: 20.0000 mg | ORAL_CAPSULE | Freq: Every day | ORAL | 0 refills | Status: DC

## 2022-12-20 MED ORDER — DULOXETINE HCL 60 MG PO CPEP: 60.0000 mg | ORAL_CAPSULE | Freq: Every day | ORAL | 0 refills | Status: DC

## 2022-12-20 NOTE — Progress Notes (Unsigned)
BH MD OP Progress Note  12/20/2022 3:07 PM [address]  MRN:  [id number]  Chief Complaint:  Chief Complaint  Patient presents with   Follow-up   Anxiety   Depression   Medication Refill   HPI: Michelle Richardson is a 22 year old African-American female, lives in [place], currently works at [employer], has a history of MDD, GAD, attention and concentration deficit was evaluated in the office today.  Patient today reports she does have situational stressors.  Reports continues to have relationship struggles with her dad who is currently struggling with health issues.  Patient also reports recently she lost a few friends who have moved away.  She reports she had a shift change at work and currently works from 10 AM to 4 PM.  She is currently trying to work on her sleep schedule.  She does have nightmares however they are better on the prazosin.  She only uses the prazosin as needed.  Patient reports she goes to bed at around 9 or 10 PM and wakes up at 5 AM.  She however reports sleep is restless and she wakes up several times in between.  She is not interested in starting a sleep aid at this time.  Would like to try melatonin.  Patient does report anxiety mostly situational, work-related.  She has not been able to find a therapist yet however is willing to do so.  Patient reports she is planning to go back to [employer] and would like to study art education.  Patient denies any suicidality, homicidality or perceptual disturbances.  Currently compliant on duloxetine, denies side effects.  Patient continues to have attention and concentration problems, and ADHD testing completed by Dr. Kieth Brightly, pending report.  Denies any other concerns today.  Visit Diagnosis:    ICD-10-CM   1. Recurrent major depressive disorder, in full remission (HCC)  F33.42 DULoxetine (CYMBALTA) 60 MG capsule    DULoxetine (CYMBALTA) 20 MG capsule    prazosin (MINIPRESS) 1 MG capsule    2. Generalized anxiety disorder   F41.1 DULoxetine (CYMBALTA) 20 MG capsule    3. Attention and concentration deficit  R41.840       Past Psychiatric History: I have reviewed past psychiatric history from progress note on 03/30/2022.  Past Medical History:  Past Medical History:  Diagnosis Date   Asthma    Pediatric obesity    Suicide ideation 01/11/2019    Past Surgical History:  Procedure Laterality Date   TONSILLECTOMY AND ADENOIDECTOMY  08/02/2008    Family Psychiatric History: I have reviewed family psychiatric history from progress note on 03/30/2022.  Family History:  Family History  Problem Relation Age of Onset   Hypertension Mother    Drug abuse Father    Autism spectrum disorder Sister     Social History: I have reviewed social history from progress note on 03/30/2022. Social History   Socioeconomic History   Marital status: Single    Spouse name: Not on file   Number of children: 0   Years of education: Not on file   Highest education level: High school graduate  Occupational History   Not on file  Tobacco Use   Smoking status: Never   Smokeless tobacco: Never  Vaping Use   Vaping status: Never Used  Substance and Sexual Activity   Alcohol use: Never   Drug use: Never   Sexual activity: Not Currently    Birth control/protection: Abstinence  Other Topics Concern   Not on file  Social History Narrative  Not on file   Social Determinants of Health   Financial Resource Strain: Low Risk  (01/05/2022)   Overall Financial Resource Strain (CARDIA)    Difficulty of Paying Living Expenses: Not hard at all  Food Insecurity: No Food Insecurity (02/08/2022)   Hunger Vital Sign    Worried About Running Out of Food in the Last Year: Never true    Ran Out of Food in the Last Year: Never true  Transportation Needs: No Transportation Needs (02/08/2022)   PRAPARE - Administrator, Civil Service (Medical): No    Lack of Transportation (Non-Medical): No  Physical Activity:  Sufficiently Active (02/08/2022)   Exercise Vital Sign    Days of Exercise per Week: 5 days    Minutes of Exercise per Session: 80 min  Stress: Stress Concern Present (02/08/2022)   [employer] of Occupational Health - Occupational Stress Questionnaire    Feeling of Stress : Very much  Social Connections: Moderately Integrated (02/08/2022)   Social Connection and Isolation Panel [NHANES]    Frequency of Communication with Friends and Family: More than three times a week    Frequency of Social Gatherings with Friends and Family: Twice a week    Attends Religious Services: More than 4 times per year    Active Member of [employer] or Organizations: Yes    Attends Banker Meetings: Never    Marital Status: Never married    Allergies: No Known Allergies  Metabolic Disorder Labs: Lab Results  Component Value Date   HGBA1C 5.8 (H) 01/06/2022   MPG 120 01/06/2022   MPG 126 01/11/2019   Lab Results  Component Value Date   PROLACTIN 11.1 01/06/2022   PROLACTIN 24.1 (H) 01/11/2019   Lab Results  Component Value Date   CHOL 153 01/06/2022   TRIG 66 01/06/2022   HDL 58 01/06/2022   CHOLHDL 2.6 01/06/2022   VLDL 12 01/11/2019   LDLCALC 80 01/06/2022   LDLCALC 91 01/11/2019   Lab Results  Component Value Date   TSH 0.73 01/06/2022   TSH 0.863 01/11/2019    Therapeutic Level Labs: No results found for: "LITHIUM" No results found for: "VALPROATE" No results found for: "CBMZ"  Current Medications: Current Outpatient Medications  Medication Sig Dispense Refill   fluticasone-salmeterol (WIXELA INHUB) 250-50 MCG/ACT AEPB INHALE 1 PUFF INTO THE LUNGS IN THE MORNING AND AT BEDTIME. 180 each 3   propranolol (INDERAL) 10 MG tablet TAKE 1 TABLET BY MOUTH 2 TIMES Richardson AS NEEDED. FOR ANXIETY 180 tablet 0   Vitamin D, Ergocalciferol, (DRISDOL) 1.25 MG (50000 UNIT) CAPS capsule Take 1 capsule (50,000 Units total) by mouth every 7 (seven) days. Please take every Thursday  starting 01/17/2019 12 capsule 1   DULoxetine (CYMBALTA) 20 MG capsule Take 1 capsule (20 mg total) by mouth Richardson. Take along with 60 mg Richardson - total of 80 mg Richardson 90 capsule 0   DULoxetine (CYMBALTA) 60 MG capsule Take 1 capsule (60 mg total) by mouth Richardson. Take along with 20 mg Richardson 90 capsule 0   prazosin (MINIPRESS) 1 MG capsule TAKE 1 CAPSULE BY MOUTH EVERYDAY AT BEDTIME 90 capsule 0   No current facility-administered medications for this visit.     Musculoskeletal: Strength & Muscle Tone: within normal limits Gait & Station: normal Patient leans: N/A  Psychiatric Specialty Exam: Review of Systems  Psychiatric/Behavioral:  Positive for sleep disturbance. The patient is nervous/anxious.     Blood pressure 118/80, pulse 76, temperature 97.7  F (36.5 C), temperature source Skin, height 5\' 10"  (1.778 m), weight (!) 307 lb 6.4 oz (139.4 kg).Body mass index is 44.11 kg/m.  General Appearance: Fairly Groomed  Eye Contact:  Fair  Speech:  Clear and Coherent  Volume:  Normal  Mood:  Anxious,sad due to situations  Affect:  Congruent  Thought Process:  Goal Directed and Descriptions of Associations: Intact  Orientation:  Full (Time, Place, and Person)  Thought Content: Logical   Suicidal Thoughts:  No  Homicidal Thoughts:  No  Memory:  Immediate;   Fair Recent;   Fair Remote;   Fair  Judgement:  Fair  Insight:  Fair  Psychomotor Activity:  Normal  Concentration:  Concentration: Fair and Attention Span: Fair  Recall:  [employer] of Knowledge: Fair  Language: Fair  Akathisia:  No  Handed:  Right  AIMS (if indicated): not done  Assets:  Communication Skills Desire for Improvement Housing Social Support  ADL's:  Intact  Cognition: WNL  Sleep:   restless   Screenings: AIMS    Flowsheet Row [facility]  AIMS Total Score 0      GAD-7    Flowsheet Row Office Visit from 08/05/2022 in [facility] Office Visit from 05/20/2022 in [facility] Counselor from 05/17/2022 in [facility] Office Visit from 05/05/2022 in [facility] Counselor from 04/15/2022 in [facility]  Total GAD-7 Score 11 12 18 13 20       PHQ2-9    Flowsheet Row Office Visit from 12/20/2022 in [facility] Office Visit from 08/05/2022 in [facility] Office Visit from 05/20/2022 in [facility] Counselor from 05/17/2022 in [facility] Office Visit from 05/05/2022 in [facility]  PHQ-2 Total Score 1 4 3 4 3   PHQ-9 Total Score -- 16 13 17 18       Flowsheet Row Office Visit from 12/20/2022 in [facility] Office Visit from 09/14/2022 in [facility] Office Visit from 05/20/2022 in [facility]  C-SSRS RISK CATEGORY No Risk No Risk Low Risk        Assessment and Plan: Theron Frakes is a 22 year old African-American female with history of depression, anxiety was evaluated in the office today.  Patient with multiple situational stressors, will continue to benefit from CBT, currently noncompliant, patient also with sleep problems, will benefit from the following plan.  Plan MDD in remission Cymbalta 80 mg p.o. Richardson   GAD-improving Propranolol 10 mg p.o. twice Richardson as needed Cymbalta 80 mg p.o. Richardson Patient was provided resources, patient to establish care with therapist.  Insomnia-unspecified-unstable Prazosin 1 mg p.o. nightly for nightmares Discussed starting a sleep medication, patient declines.  Patient to try melatonin over-the-counter. Patient to work on  sleep hygiene techniques.  Attention and concentration deficit-patient had testing completed, pending report.  Follow-up in clinic in 1 to 2 months or sooner if needed.  Collaboration of Care: Collaboration of Care: Referral or follow-up with counselor/therapist AEB patient arrange to establish care with therapist.  Patient/Guardian was advised Release of Information must be obtained prior to any record release in order to collaborate their care with an outside provider. Patient/Guardian was advised if they have not already done so  to contact the registration department to sign all necessary forms in order for [place] to release information regarding their care.   Consent: Patient/Guardian gives verbal consent for treatment and assignment of benefits for services provided during this visit. Patient/Guardian expressed understanding and agreed to proceed.   This note was generated in part or whole with voice recognition software. Voice recognition is usually quite accurate but there are transcription errors that can and very often do occur. I apologize for any typographical errors that were not detected and corrected.    Jomarie Longs, MD 12/20/2022, 3:07 PM

## 2022-12-21 DIAGNOSIS — F3342 Major depressive disorder, recurrent, in full remission: Secondary | ICD-10-CM | POA: Insufficient documentation

## 2022-12-21 DIAGNOSIS — G4700 Insomnia, unspecified: Secondary | ICD-10-CM | POA: Insufficient documentation

## 2023-01-12 ENCOUNTER — Encounter: Payer: BC Managed Care – PPO | Attending: Psychology | Admitting: Psychology

## 2023-01-12 DIAGNOSIS — F5101 Primary insomnia: Secondary | ICD-10-CM | POA: Diagnosis not present

## 2023-01-12 DIAGNOSIS — R41840 Attention and concentration deficit: Secondary | ICD-10-CM

## 2023-01-12 DIAGNOSIS — F411 Generalized anxiety disorder: Secondary | ICD-10-CM

## 2023-01-12 DIAGNOSIS — F331 Major depressive disorder, recurrent, moderate: Secondary | ICD-10-CM

## 2023-01-18 ENCOUNTER — Encounter: Payer: Self-pay | Admitting: Psychology

## 2023-01-18 NOTE — Progress Notes (Signed)
Neuropsychological Evaluation   Patient:  Michelle Richardson   DOB: 02/25/2001  MR Number: [id number]  Location: [facility] [place] [facility] [address] [place], [address] [place] [address] Dept: [phone]  Start: 3 PM End: For a.m.  Provider/Observer:     Hershal Coria PsyD  Chief Complaint:      Chief Complaint  Patient presents with   Anxiety   Depression   Other    Attention and concentration difficulties   01/12/2023: Today I provided feedback regarding the results of the recent neuropsychological evaluation.  We went over diagnostic considerations and results of formal neuropsychological testing with significant recommendations for the patient herself and review recommendations are provided to her treating psychiatrist.  Below I have included the results of the formal neuropsychological evaluations reason for service and summary for convenience.  The entire evaluation can be found in the patient's EMR dated 11/15/2022.  Reason For Service:      Michelle Richardson is a 22 year old female referred for neuropsychological evaluation by her treating psychiatrist Jomarie Longs, MD, due to ongoing issues with attention and concentration in order to aid in differential diagnostic considerations.  The patient has previously been diagnosed with major depressive disorder as well as generalized anxiety disorder along with attention and concentration deficits.  Patient has a past medical history including intermittent and poorly controlled asthma, morbid obesity and vitamin D deficiency.  Patient continues to have issues related to sadness/depression and anxiety with some acute psychosocial stressors.  Patient has a history of being bullied from kindergarten through high school as well as a history of issues with attention and concentration beginning in early elementary school at least.  Patient reports a very erratic sleep  schedule and describes her sleep pattern as "all over the place."  The patient reports that she typically gets between 3 and 5 hours of sleep and does have some third shift work that resulted in her not getting enough sleep as she has lots of things to do during the day.    The patient reports recurrent nightmares at times but denies that they are negatively impacting her sleep duration.  She describes good sleep hygiene.  Patient admits to snoring but does not report that she feels fatigued or somnolent during the day.  I did suggest she talk with her PCP about home sleep study just to rule out possible issues of obstructive sleep apnea.   The patient talks about her anxiety and depression symptoms that were associated with stressors in early childhood relating to her parents divorcing and being bullied in school starting as young as kindergarten.  The patient reports that she also had attentional issues that she was not fully aware of but was aware that she had a very prominent feature of daydreaming in school and was distracted from her active schoolwork.  This was noted by her mother, who made sure the teachers were aware and efforts were made to redirect her attention during school.  Patient was never diagnosed or treated for attention deficit disorder as a child and has never taken any type of psychostimulant medications.  Current psychotropic medications include Cymbalta, which she describes as tolerating well with no apparent side effects.  Patient is also taking prazosin to help with some of her anxiety symptoms.  Patient continues to have some current stressors including her father's medical status.  The patient did have an inpatient hospitalization when she was a young teenager  in the [facility] due to depression.  This occurred when she was 22 years old and in the 10th grade.  The patient had a precipitous fall in her grades into the range of just passing from being on the AB honor  roll due to worsening symptoms of depression and sleeping more than usual and not being able to participate in school activities.  The patient was admitted for worsening depression with suicidal thoughts with plan of killing herself with no specific plan noted.  Patient has struggled with remote learning in school during the COVID pandemic era and the patient was stressed with the separation and divorce of her parents, which was very traumatic for her.  She was only having occasional contact with her father and only communicating by phone.  She had previous episodes of depression in school and had engaged in limited self-harm behavior.   Patient reports that she feels like much of her depression and anxiety revert back to experience that she had in childhood and the way she grew up in general.  She describes being actively bullied from kindergarten through 12th grade and struggling to figure out her life currently.  Patient reports that she is having a lot of trouble now with her anxiety most of the time.   When asked about specific attentional issues, patient describes difficulties with focusing on 1 thing at a time and reports that it feels normal for her to have her brain always running and thinking about various aspects.  The patient reports that her daydreams both in school and other parts of her life and still to this day they tend to be around imagining what will be happening in the future with regard to her activities and working through different scenarios in her mind about future activities.  It is unclear objectively whether this daydreaming is more of a coping mechanism to avoid things or redirect stressful aspects of her current life or more of an attentional issue with inability to stay focused.   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  There were some concerns about potential neurological findings and the patient had a  ultrasound head performed 2 days after she was born.  There were no indications of any acute abnormalities and no evidence of periventricular leukomalacia being suggested with this ultrasound.  The patient was born in [facility] and grew up with 4 siblings.  The patient currently lives with her mother and continues to live with her mother.  The patient's parents divorced when she was very young.  The patient has a sister, who has been diagnosed with autism and another sister and brother has been diagnosed with depression.  The patient has no children.    Impression/Diagnosis:   Overall, the results of the current neuropsychological evaluation of a wide range of attention and concentration/executive functioning measures were consistent with the patient describing difficulties with attention and concentration.  However, the patient did very well on many aspects of attention including focus execute abilities, cognitive shifting, freedom from external distraction, auditory and visual encoding capacity and capacity to divide and shift attention.  There was only 1 area of attention and concentration that showed objective deficits and this had to do with her performance on the visual continual performance test.  Close analysis of the results show that the patient consistently showed elevated errors of omission that were generally consistent throughout this 15-minute task.  The patient maintained a steady information processing speed/reaction time throughout the first 12 minutes of this task but did show a slowing at the very end of this 15-minute measure.  This pattern of excellent attentional variables up until the very end of our assessment and the specific pattern displayed on the continuous performance test is much more consistent with attentional deficits you might see due to fatigue and drowsiness developing rather than a pattern that is typically seen with attention deficit disorder.  The patient  showed consistent problems starting at the very beginning of this 15-minute task and maintain the same pattern roughly throughout this measure rather than showing a progressive deterioration as a function of the evolution of performance with this CPT measure.  As far as diagnostic considerations, the patient clearly has significant issues with depression and anxiety which of been present throughout her life with significant psychosocial stressors throughout.  The patient has had major depressive events and adolescence that were driven by significant psychosocial stressors leading to inpatient hospitalization in adolescence.  At the current time, the patient continues with a number of psychosocial stressors and difficulties adjusting to and coping with these challenging life events.  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  This is clearly an inadequate amount of sleep and is likely playing a role in her attentional deficits and the objective assessment would be consistent with those types of patterns.  While the patient may initially experience an improvement in cognition with a psychostimulant I suspect it would be indirectly addressing aspects of insomnia and sleep insufficiency.  There is also the possibility of an obstructive sleep apnea which I have already encouraged the patient to address with her PCP to see if she could have a home sleep study to rule in or rule out this possibility.  I do suspect that the attention and concentration issues are directly related to a combination of her recurrent major depressive events and significant anxiety in conjunction with her inadequate sleep patterns and may be exacerbated and worsened by obstructive sleep apnea.  The patient reports that she is responding well to her  psychotropic medications without side effects or complications.  As far as treatment recommendations, I would encourage her to continue to work on psychotropic efforts being performed by her treating psychiatrist and to look aggressively into issues of improving sleep overall and being evaluated for potential obstructive sleep apnea.  As this pattern of objective neuropsychological performance is not consistent with the pattern seen with adult residual attention deficit disorder and the patient actually performed quite well on all of the attentional measures with the exception of the CPT measure (CPT performance was more consistent with overall fatigue and somnolence rather than a progressive pattern of deterioration as a function of time) a primary focus should be on continuing to address her depression and anxiety.  I would suggest continuation or involvement with psychotherapeutic efforts as the patient has had a lot of negative reinforcement throughout her life and a lot of significant psychosocial issues that she continues to have difficulties coping and adjusting to.  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  I will sit down with the patient to go over the results of the current neuropsychological evaluation and make sure that a copy of this evaluation gets to referring psychiatrist Dr. Elna Breslow and made available in her EMR for other appropriate medical professionals including her PCP to have access to.  Diagnosis:    Moderate episode of recurrent major depressive disorder (HCC)  GAD (generalized anxiety disorder)  Primary insomnia  Attention and concentration deficit   _____________________ Arley Phenix, Psy.D. Clinical Neuropsychologist

## 2023-02-14 NOTE — Progress Notes (Unsigned)
Name: Michelle Richardson   MRN: [id number]    DOB: Dec 17, 2000   Date:02/15/2023       Progress Note  Subjective  Chief Complaint  Annual Exam  HPI  Patient presents for annual CPE.  Diet: she is drinking more water, eating greek yogurt, pineapple. Advised to cut down on orange juice  Exercise: continue regular activity   Last Eye Exam: 11/2022  Last Dental Exam: she needs to have it on a regular basis   Flowsheet Row Office Visit from 02/15/2023 in [facility]  AUDIT-C Score 0      Depression: Phq 9 is  positive    02/15/2023   10:54 AM 12/20/2022    2:49 PM 08/05/2022   11:08 AM 05/20/2022   11:17 AM 05/17/2022    4:56 PM  Depression screen PHQ 2/9  Decreased Interest 1  2    Down, Depressed, Hopeless 2  2    PHQ - 2 Score 3  4    Altered sleeping 3  1    Tired, decreased energy   2    Change in appetite 3  3    Feeling bad or failure about yourself  1  1    Trouble concentrating 3  3    Moving slowly or fidgety/restless 2  2    Suicidal thoughts 0  0    PHQ-9 Score 15  16    Difficult doing work/chores Somewhat difficult  Somewhat difficult       Information is confidential and restricted. Go to Review Flowsheets to unlock data.   Hypertension: BP Readings from Last 3 Encounters:  02/15/23 116/78  08/05/22 134/74  05/05/22 126/82   Obesity: Wt Readings from Last 3 Encounters:  02/15/23 (!) 308 lb (139.7 kg)  08/05/22 (!) 308 lb (139.7 kg) (>99%, Z= 2.87)*  05/05/22 (!) 313 lb 12.8 oz (142.3 kg) (>99%, Z= 2.87)*   * Growth percentiles are based on CDC (Girls, 2-20 Years) data.   BMI Readings from Last 3 Encounters:  02/15/23 44.83 kg/m  08/05/22 44.19 kg/m (>99%, Z= 2.60)*  05/05/22 45.03 kg/m (>99%, Z= 2.71)*   * Growth percentiles are based on CDC (Girls, 2-20 Years) data.     Vaccines:   HPV: Completed Tdap: 07/17/2014 Shingrix: N/A Pneumonia:today Flu: today  COVID-19: Completed 3 doses   Hep C Screening:  01/06/2022 STD testing and prevention (HIV/chl/gon/syphilis): we will recheck it today  Intimate partner violence: negative screen  Sexual History : not currently  Menstrual History/LMP/Abnormal Bleeding: regular cycles, discussed taking iron supplementation at least during her cycles  Discussed importance of follow up if any post-menopausal bleeding: not applicable  Incontinence Symptoms: negative for symptoms   Breast cancer:  - Last Mammogram: N/A - BRCA gene screening:   Osteoporosis Prevention : Discussed high calcium and vitamin D supplementation, weight bearing exercises Bone density :not applicable   Cervical cancer screening: start at age 28   Skin cancer: Discussed monitoring for atypical lesions  Colorectal cancer: N/A   Lung cancer:  Low Dose CT Chest recommended if Age 45-80 years, 20 pack-year currently smoking OR have quit w/in 15years. Patient does not qualify for screen   ECG: N/A  Advanced Care Planning: A voluntary discussion about advance care planning including the explanation and discussion of advance directives.  Discussed health care proxy and Living will, and the patient was able to identify a health care proxy as mother .  Patient does not have a living will and  power of attorney of health care   Lipids: Lab Results  Component Value Date   CHOL 153 01/06/2022   CHOL 150 01/11/2019   Lab Results  Component Value Date   HDL 58 01/06/2022   HDL 47 01/11/2019   Lab Results  Component Value Date   LDLCALC 80 01/06/2022   LDLCALC 91 01/11/2019   Lab Results  Component Value Date   TRIG 66 01/06/2022   TRIG 59 01/11/2019   Lab Results  Component Value Date   CHOLHDL 2.6 01/06/2022   CHOLHDL 3.2 01/11/2019   No results found for: "LDLDIRECT"  Glucose: Glucose, Bld  Date Value Ref Range Status  01/06/2022 85 65 - 99 mg/dL Final    Comment:    .            Fasting reference interval .   01/11/2019 93 70 - 99 mg/dL Final  16/01/9603 540 (H)  70 - 99 mg/dL Final    Patient Active Problem List   Diagnosis Date Noted   Prediabetes 02/15/2023   Iron deficiency anemia due to chronic blood loss 02/15/2023   Insomnia 12/21/2022   Recurrent major depressive disorder, in full remission (HCC) 12/21/2022   Attention and concentration deficit 03/31/2022   Morbid obesity (HCC) 01/05/2022   Vitamin D deficiency 01/05/2022   Asthma, mild intermittent, poorly controlled 01/05/2022   Generalized anxiety disorder 04/16/2019   Moderate episode of recurrent major depressive disorder (HCC) 04/16/2019    Past Surgical History:  Procedure Laterality Date   TONSILLECTOMY AND ADENOIDECTOMY  08/02/2008    Family History  Problem Relation Age of Onset   Hypertension Mother    Drug abuse Father    Autism spectrum disorder Sister     Social History   Socioeconomic History   Marital status: Single    Spouse name: Not on file   Number of children: 0   Years of education: Not on file   Highest education level: High school graduate  Occupational History   Not on file  Tobacco Use   Smoking status: Never   Smokeless tobacco: Never  Vaping Use   Vaping status: Never Used  Substance and Sexual Activity   Alcohol use: Never   Drug use: Never   Sexual activity: Not Currently    Birth control/protection: Abstinence  Other Topics Concern   Not on file  Social History Narrative   Not on file   Social Determinants of Health   Financial Resource Strain: Low Risk  (02/15/2023)   Overall Financial Resource Strain (CARDIA)    Difficulty of Paying Living Expenses: Not hard at all  Food Insecurity: No Food Insecurity (02/15/2023)   Hunger Vital Sign    Worried About Running Out of Food in the Last Year: Never true    Ran Out of Food in the Last Year: Never true  Transportation Needs: No Transportation Needs (02/15/2023)   PRAPARE - Administrator, Civil Service (Medical): No    Lack of Transportation (Non-Medical): No   Physical Activity: Sufficiently Active (02/15/2023)   Exercise Vital Sign    Days of Exercise per Week: 5 days    Minutes of Exercise per Session: 120 min  Stress: Stress Concern Present (02/15/2023)   [employer] of Occupational Health - Occupational Stress Questionnaire    Feeling of Stress : Rather much  Social Connections: Moderately Isolated (02/15/2023)   Social Connection and Isolation Panel [NHANES]    Frequency of Communication with Friends and Family: More  than three times a week    Frequency of Social Gatherings with Friends and Family: Twice a week    Attends Religious Services: More than 4 times per year    Active Member of [employer] or Organizations: No    Attends Banker Meetings: Never    Marital Status: Never married  Intimate Partner Violence: Not At Risk (02/15/2023)   Humiliation, Afraid, Rape, and Kick questionnaire    Fear of Current or Ex-Partner: No    Emotionally Abused: No    Physically Abused: No    Sexually Abused: No     Current Outpatient Medications:    DULoxetine (CYMBALTA) 20 MG capsule, Take 1 capsule (20 mg total) by mouth daily. Take along with 60 mg daily - total of 80 mg daily, Disp: 90 capsule, Rfl: 0   DULoxetine (CYMBALTA) 60 MG capsule, Take 1 capsule (60 mg total) by mouth daily. Take along with 20 mg daily, Disp: 90 capsule, Rfl: 0   fluticasone-salmeterol (WIXELA INHUB) 250-50 MCG/ACT AEPB, INHALE 1 PUFF INTO THE LUNGS IN THE MORNING AND AT BEDTIME., Disp: 180 each, Rfl: 3   prazosin (MINIPRESS) 1 MG capsule, TAKE 1 CAPSULE BY MOUTH EVERYDAY AT BEDTIME, Disp: 90 capsule, Rfl: 0   propranolol (INDERAL) 10 MG tablet, TAKE 1 TABLET BY MOUTH 2 TIMES DAILY AS NEEDED. FOR ANXIETY, Disp: 180 tablet, Rfl: 0   Vitamin D, Ergocalciferol, (DRISDOL) 1.25 MG (50000 UNIT) CAPS capsule, Take 1 capsule (50,000 Units total) by mouth every 7 (seven) days. Please take every Thursday starting 01/17/2019, Disp: 12 capsule, Rfl: 1  No Known  Allergies   ROS  Constitutional: Negative for fever or weight change.  Respiratory: Negative for cough and shortness of breath.   Cardiovascular: Negative for chest pain or palpitations.  Gastrointestinal: Negative for abdominal pain, no bowel changes.  Musculoskeletal: Negative for gait problem or joint swelling.  Skin: Negative for rash.  Neurological: Negative for dizziness or headache.  No other specific complaints in a complete review of systems (except as listed in HPI above).   Objective  Vitals:   02/15/23 1053  BP: 116/78  Pulse: 98  Resp: 16  Temp: 98.1 F (36.7 C)  TempSrc: Oral  SpO2: 100%  Weight: (!) 308 lb (139.7 kg)  Height: 5' 9.5" (1.765 m)    Body mass index is 44.83 kg/m.  Physical Exam  Constitutional: Patient appears well-developed and well-nourished. No distress.  HENT: Head: Normocephalic and atraumatic. Ears: B TMs ok, no erythema or effusion; Nose: Nose normal. Mouth/Throat: Oropharynx is clear and moist. No oropharyngeal exudate.  Eyes: Conjunctivae and EOM are normal. Pupils are equal, round, and reactive to light. No scleral icterus.  Neck: Normal range of motion. Neck supple. No JVD present. No thyromegaly present.  Cardiovascular: Normal rate, regular rhythm and normal heart sounds.  No murmur heard. No BLE edema. Pulmonary/Chest: Effort normal and breath sounds normal. No respiratory distress. Abdominal: Soft. Bowel sounds are normal, no distension. There is no tenderness. no masses Breast: no lumps or masses, no nipple discharge or rashes FEMALE GENITALIA:  Not done  RECTAL: not done  Musculoskeletal: Normal range of motion, no joint effusions. No gross deformities Neurological: he is alert and oriented to person, place, and time. No cranial nerve deficit. Coordination, balance, strength, speech and gait are normal.  Skin: Skin is warm and dry. No rash noted. No erythema.  Psychiatric: Patient has a normal mood and affect. behavior is  normal. Judgment and thought content normal.  Fall Risk:    02/15/2023   10:56 AM 08/05/2022   10:55 AM 05/05/2022    9:31 AM 02/08/2022   11:31 AM 02/02/2022   11:23 AM  Fall Risk   Falls in the past year? 0 0 0 0 0  Number falls in past yr:   0    Injury with Fall?   0    Risk for fall due to : No Fall Risks No Fall Risks No Fall Risks No Fall Risks No Fall Risks  Follow up Falls prevention discussed;Education provided;Falls evaluation completed Falls prevention discussed Falls prevention discussed;Education provided;Falls evaluation completed Falls prevention discussed;Education provided;Falls evaluation completed Falls prevention discussed;Education provided;Falls evaluation completed     Functional Status Survey: Is the patient deaf or have difficulty hearing?: No Does the patient have difficulty seeing, even when wearing glasses/contacts?: Yes Does the patient have difficulty concentrating, remembering, or making decisions?: Yes Does the patient have difficulty walking or climbing stairs?: No Does the patient have difficulty dressing or bathing?: No Does the patient have difficulty doing errands alone such as visiting a doctor's office or shopping?: No   Assessment & Plan  1. Well adult exam  - CBC with Differential - Flu vaccine trivalent PF, 6mos and older(Flulaval,Afluria,Fluarix,Fluzone) - RPR - HIV Antibody (routine testing w rflx) - Cervicovaginal ancillary only - Hemoglobin A1c - VITAMIN D 25 Hydroxy (Vit-D Deficiency, Fractures) - Prolactin - Lipid panel - COMPLETE METABOLIC PANEL WITH GFR - B12 and Folate Panel  2. Need for immunization against influenza  - Flu vaccine trivalent PF, 6mos and older(Flulaval,Afluria,Fluarix,Fluzone)  3. Routine screening for STI (sexually transmitted infection)  - RPR - HIV Antibody (routine testing w rflx) - Cervicovaginal ancillary only  4. Iron deficiency anemia due to chronic blood loss  -Iron, TIBC and  ferritin  5. Vitamin D deficiency  - VITAMIN D 25 Hydroxy (Vit-D Deficiency, Fractures)  6. Elevated prolactin level  - Prolactin  7. Prediabetes  - Hemoglobin A1c  8. Lipid screening  - Lipid panel  9. Other fatigue  - B12 and Folate Panel  10. Need for pneumococcal 20-valent conjugate vaccination  - Pneumococcal conjugate vaccine 20-valent (Prevnar 20)   -USPSTF grade A and B recommendations reviewed with patient; age-appropriate recommendations, preventive care, screening tests, etc discussed and encouraged; healthy living encouraged; see AVS for patient education given to patient -Discussed importance of 150 minutes of physical activity weekly, eat two servings of fish weekly, eat one serving of tree nuts ( cashews, pistachios, pecans, almonds.Marland Kitchen) every other day, eat 6 servings of fruit/vegetables daily and drink plenty of water and avoid sweet beverages.   -Reviewed Health Maintenance: Yes.

## 2023-02-15 ENCOUNTER — Ambulatory Visit (INDEPENDENT_AMBULATORY_CARE_PROVIDER_SITE_OTHER): Payer: BC Managed Care – PPO | Admitting: Family Medicine

## 2023-02-15 ENCOUNTER — Other Ambulatory Visit (HOSPITAL_COMMUNITY)
Admission: RE | Admit: 2023-02-15 | Discharge: 2023-02-15 | Disposition: A | Discharge status: Home / Self Care | Payer: BC Managed Care – PPO | Source: Ambulatory Visit | Attending: Family Medicine | Admitting: Family Medicine

## 2023-02-15 ENCOUNTER — Encounter: Payer: Self-pay | Admitting: Family Medicine

## 2023-02-15 VITALS — BP 116/78 | HR 98 | Temp 98.1°F | Resp 16 | Ht 69.5 in | Wt 308.0 lb

## 2023-02-15 DIAGNOSIS — R5383 Other fatigue: Secondary | ICD-10-CM

## 2023-02-15 DIAGNOSIS — E559 Vitamin D deficiency, unspecified: Secondary | ICD-10-CM

## 2023-02-15 DIAGNOSIS — Z0001 Encounter for general adult medical examination with abnormal findings: Secondary | ICD-10-CM

## 2023-02-15 DIAGNOSIS — Z113 Encounter for screening for infections with a predominantly sexual mode of transmission: Secondary | ICD-10-CM

## 2023-02-15 DIAGNOSIS — Z23 Encounter for immunization: Secondary | ICD-10-CM | POA: Diagnosis not present

## 2023-02-15 DIAGNOSIS — R7303 Prediabetes: Secondary | ICD-10-CM

## 2023-02-15 DIAGNOSIS — D500 Iron deficiency anemia secondary to blood loss (chronic): Secondary | ICD-10-CM

## 2023-02-15 DIAGNOSIS — R7989 Other specified abnormal findings of blood chemistry: Secondary | ICD-10-CM

## 2023-02-15 DIAGNOSIS — Z0000 Encounter for general adult medical examination without abnormal findings: Secondary | ICD-10-CM | POA: Insufficient documentation

## 2023-02-15 DIAGNOSIS — Z13220 Encounter for screening for lipoid disorders: Secondary | ICD-10-CM

## 2023-02-16 LAB — CBC WITH DIFFERENTIAL/PLATELET
Absolute Lymphocytes: 2562 {cells}/uL (ref 850–3900)
Absolute Monocytes: 672 {cells}/uL (ref 200–950)
Basophils Absolute: 42 {cells}/uL (ref 0–200)
Basophils Relative: 0.6 %
Eosinophils Absolute: 231 {cells}/uL (ref 15–500)
Eosinophils Relative: 3.3 %
HCT: 37.9 % (ref 35.0–45.0)
Hemoglobin: 11.5 g/dL — ABNORMAL LOW (ref 11.7–15.5)
MCH: 23.8 pg — ABNORMAL LOW (ref 27.0–33.0)
MCHC: 30.3 g/dL — ABNORMAL LOW (ref 32.0–36.0)
MCV: 78.5 fL — ABNORMAL LOW (ref 80.0–100.0)
MPV: 11.9 fL (ref 7.5–12.5)
Monocytes Relative: 9.6 %
Neutro Abs: 3493 {cells}/uL (ref 1500–7800)
Neutrophils Relative %: 49.9 %
Platelets: 356 10*3/uL (ref 140–400)
RBC: 4.83 10*6/uL (ref 3.80–5.10)
RDW: 14.8 % (ref 11.0–15.0)
Total Lymphocyte: 36.6 %
WBC: 7 10*3/uL (ref 3.8–10.8)

## 2023-02-16 LAB — COMPLETE METABOLIC PANEL WITH GFR
AG Ratio: 1.4 (calc) (ref 1.0–2.5)
ALT: 7 U/L (ref 6–29)
AST: 11 U/L (ref 10–30)
Albumin: 4.1 g/dL (ref 3.6–5.1)
Alkaline phosphatase (APISO): 91 U/L (ref 31–125)
BUN: 12 mg/dL (ref 7–25)
CO2: 27 mmol/L (ref 20–32)
Calcium: 9.9 mg/dL (ref 8.6–10.2)
Chloride: 107 mmol/L (ref 98–110)
Creat: 0.86 mg/dL (ref 0.50–0.96)
Globulin: 2.9 g/dL (ref 1.9–3.7)
Glucose, Bld: 78 mg/dL (ref 65–99)
Potassium: 4.7 mmol/L (ref 3.5–5.3)
Sodium: 142 mmol/L (ref 135–146)
Total Bilirubin: 0.3 mg/dL (ref 0.2–1.2)
Total Protein: 7 g/dL (ref 6.1–8.1)
eGFR: 99 mL/min/{1.73_m2} (ref >=60)

## 2023-02-16 LAB — IRON,TIBC AND FERRITIN PANEL
%SAT: 7 % — ABNORMAL LOW (ref 16–45)
Ferritin: 8 ng/mL — ABNORMAL LOW (ref 16–154)
Iron: 27 ug/dL — ABNORMAL LOW (ref 40–190)
TIBC: 364 ug/dL (ref 250–450)

## 2023-02-16 LAB — B12 AND FOLATE PANEL
Folate: 6.2 ng/mL
Vitamin B-12: 417 pg/mL (ref 200–1100)

## 2023-02-16 LAB — LIPID PANEL
Cholesterol: 144 mg/dL (ref <200)
HDL: 60 mg/dL (ref >=50)
LDL Cholesterol (Calc): 69 mg/dL
Non-HDL Cholesterol (Calc): 84 mg/dL (ref <130)
Total CHOL/HDL Ratio: 2.4 (calc) (ref <5.0)
Triglycerides: 74 mg/dL (ref <150)

## 2023-02-16 LAB — HEMOGLOBIN A1C
Hgb A1c MFr Bld: 6 %{Hb} — ABNORMAL HIGH (ref <5.7)
Mean Plasma Glucose: 126 mg/dL
eAG (mmol/L): 7 mmol/L

## 2023-02-16 LAB — CERVICOVAGINAL ANCILLARY ONLY
Chlamydia: NEGATIVE
Comment: NEGATIVE
Comment: NORMAL
Neisseria Gonorrhea: NEGATIVE

## 2023-02-16 LAB — PROLACTIN: Prolactin: 8.3 ng/mL

## 2023-02-16 LAB — HIV ANTIBODY (ROUTINE TESTING W REFLEX): HIV 1&2 Ab, 4th Generation: NONREACTIVE

## 2023-02-16 LAB — VITAMIN D 25 HYDROXY (VIT D DEFICIENCY, FRACTURES): Vit D, 25-Hydroxy: 14 ng/mL — ABNORMAL LOW (ref 30–100)

## 2023-02-16 LAB — RPR: RPR Ser Ql: NONREACTIVE

## 2023-02-21 ENCOUNTER — Ambulatory Visit: Payer: BC Managed Care – PPO | Admitting: Psychiatry

## 2023-02-21 ENCOUNTER — Encounter: Payer: Self-pay | Admitting: Psychiatry

## 2023-02-21 VITALS — BP 126/85 | HR 101 | Temp 97.4°F | Ht 69.5 in | Wt 310.2 lb

## 2023-02-21 DIAGNOSIS — F5101 Primary insomnia: Secondary | ICD-10-CM

## 2023-02-21 DIAGNOSIS — R41840 Attention and concentration deficit: Secondary | ICD-10-CM

## 2023-02-21 DIAGNOSIS — F411 Generalized anxiety disorder: Secondary | ICD-10-CM | POA: Diagnosis not present

## 2023-02-21 DIAGNOSIS — F3342 Major depressive disorder, recurrent, in full remission: Secondary | ICD-10-CM

## 2023-02-21 MED ORDER — BUSPIRONE HCL 5 MG PO TABS: 5.0000 mg | ORAL_TABLET | Freq: Two times a day (BID) | ORAL | 1 refills | Status: DC

## 2023-02-21 NOTE — Progress Notes (Unsigned)
BH MD OP Progress Note  02/21/2023 5:10 PM Michelle Richardson  MRN:  [id number]  Chief Complaint:  Chief Complaint  Patient presents with   Follow-up   Depression   Anxiety   Medication Refill   HPI: Michelle Richardson is a 22 year old African-American female, lives in [place], currently works at [employer], has a history of MDD, GAD, attention and concentration deficit, insomnia was evaluated in office today.  Patient today reports she has noticed worsening mood symptoms in the past several weeks.  She reports multiple psychosocial stressors including the fact that her dad is not active in her life.  Patient reports she has several friends who has a dad in their life and that makes her sadder.  Patient reports her mother is a big support system for her and she enjoys that.  Patient reports she is currently working at [employer], although she does not enjoy her work much, she has a goal to become a Runner, broadcasting/film/video in the future.  She would like to work towards that goal.  She reports she however has been procrastinating a lot.  She would like to get started at [facility] [employer], [employer]  She however does not know where to start.  She does have upcoming appointment scheduled with a new psychotherapist and is motivated to start CBT.  Patient currently does report sadness, hopelessness at times, episodes of anxiety, feeling overwhelmed, concentration problems.  Patient had ADHD testing completed by Dr. Lindon Romp 01/12/2023, reviewed and discussed the results, patient does not meet criteria for ADHD rather her mood symptoms as well as sleep problems likely contributing to her concentration problems.  Patient today reports she does struggle with sleep.  She reports she does have trouble falling asleep and sleep is interrupted.  Patient does some work on a third shift capacity and she gets only around 3 to 5 hours of sleep per day.  She does have recurrent nightmares.  She uses prazosin on and off as needed for her nightmares which  helps.  Patient does report snoring on and off.  She is interested in trial of melatonin over-the-counter prior to trial of a sleep medication prescribed.  Patient currently denies any current suicidality, homicidality or perceptual disturbances.  Patient denies any other concerns today.  Visit Diagnosis:    ICD-10-CM   1. Recurrent major depressive disorder, in full remission (HCC)  F33.42 busPIRone (BUSPAR) 5 MG tablet    2. Generalized anxiety disorder  F41.1 busPIRone (BUSPAR) 5 MG tablet    3. Attention and concentration deficit  R41.840     4. Primary insomnia  F51.01       Past Psychiatric History: I have reviewed past psychiatric history from progress note on 03/30/2022.  Past Medical History:  Past Medical History:  Diagnosis Date   Asthma    Pediatric obesity    Suicide ideation 01/11/2019    Past Surgical History:  Procedure Laterality Date   TONSILLECTOMY AND ADENOIDECTOMY  08/02/2008    Family Psychiatric History: I have reviewed family psychiatric history from progress note on 03/30/2022.  Family History:  Family History  Problem Relation Age of Onset   Hypertension Mother    Drug abuse Father    Autism spectrum disorder Sister     Social History: I have reviewed social history from progress note on 03/30/2022. Social History   Socioeconomic History   Marital status: Single    Spouse name: Not on file   Number of children: 0   Years of education: Not on file  Highest education level: High school graduate  Occupational History   Not on file  Tobacco Use   Smoking status: Never   Smokeless tobacco: Never  Vaping Use   Vaping status: Never Used  Substance and Sexual Activity   Alcohol use: Never   Drug use: Never   Sexual activity: Not Currently    Birth control/protection: Abstinence  Other Topics Concern   Not on file  Social History Narrative   Not on file   Social Determinants of Health   Financial Resource Strain: Low Risk   (02/15/2023)   Overall Financial Resource Strain (CARDIA)    Difficulty of Paying Living Expenses: Not hard at all  Food Insecurity: No Food Insecurity (02/15/2023)   Hunger Vital Sign    Worried About Running Out of Food in the Last Year: Never true    Ran Out of Food in the Last Year: Never true  Transportation Needs: No Transportation Needs (02/15/2023)   PRAPARE - Administrator, Civil Service (Medical): No    Lack of Transportation (Non-Medical): No  Physical Activity: Sufficiently Active (02/15/2023)   Exercise Vital Sign    Days of Exercise per Week: 5 days    Minutes of Exercise per Session: 120 min  Stress: Stress Concern Present (02/15/2023)   [employer] of Occupational Health - Occupational Stress Questionnaire    Feeling of Stress : Rather much  Social Connections: Moderately Isolated (02/15/2023)   Social Connection and Isolation Panel [NHANES]    Frequency of Communication with Friends and Family: More than three times a week    Frequency of Social Gatherings with Friends and Family: Twice a week    Attends Religious Services: More than 4 times per year    Active Member of Clubs or Organizations: No    Attends Engineer, structural: Never    Marital Status: Never married    Allergies: No Known Allergies  Metabolic Disorder Labs: Lab Results  Component Value Date   HGBA1C 6.0 (H) 02/15/2023   MPG 126 02/15/2023   MPG 120 01/06/2022   Lab Results  Component Value Date   PROLACTIN 8.3 02/15/2023   PROLACTIN 11.1 01/06/2022   Lab Results  Component Value Date   CHOL 144 02/15/2023   TRIG 74 02/15/2023   HDL 60 02/15/2023   CHOLHDL 2.4 02/15/2023   VLDL 12 01/11/2019   LDLCALC 69 02/15/2023   LDLCALC 80 01/06/2022   Lab Results  Component Value Date   TSH 0.73 01/06/2022   TSH 0.863 01/11/2019    Therapeutic Level Labs: No results found for: "LITHIUM" No results found for: "VALPROATE" No results found for:  "CBMZ"  Current Medications: Current Outpatient Medications  Medication Sig Dispense Refill   busPIRone (BUSPAR) 5 MG tablet Take 1 tablet (5 mg total) by mouth 2 (two) times daily. 60 tablet 1   DULoxetine (CYMBALTA) 20 MG capsule Take 1 capsule (20 mg total) by mouth daily. Take along with 60 mg daily - total of 80 mg daily 90 capsule 0   DULoxetine (CYMBALTA) 60 MG capsule Take 1 capsule (60 mg total) by mouth daily. Take along with 20 mg daily 90 capsule 0   fluticasone-salmeterol (WIXELA INHUB) 250-50 MCG/ACT AEPB INHALE 1 PUFF INTO THE LUNGS IN THE MORNING AND AT BEDTIME. 180 each 3   prazosin (MINIPRESS) 1 MG capsule TAKE 1 CAPSULE BY MOUTH EVERYDAY AT BEDTIME 90 capsule 0   propranolol (INDERAL) 10 MG tablet TAKE 1 TABLET BY MOUTH 2  TIMES DAILY AS NEEDED. FOR ANXIETY 180 tablet 0   Vitamin D, Ergocalciferol, (DRISDOL) 1.25 MG (50000 UNIT) CAPS capsule Take 1 capsule (50,000 Units total) by mouth every 7 (seven) days. Please take every Thursday starting 01/17/2019 12 capsule 1   No current facility-administered medications for this visit.     Musculoskeletal: Strength & Muscle Tone: within normal limits Gait & Station: normal Patient leans: N/A  Psychiatric Specialty Exam: Review of Systems  Psychiatric/Behavioral:  Positive for decreased concentration, dysphoric mood and sleep disturbance. The patient is nervous/anxious.     Blood pressure 126/85, pulse (!) 101, temperature (!) 97.4 F (36.3 C), temperature source Skin, height 5' 9.5" (1.765 m), weight (!) 310 lb 3.2 oz (140.7 kg), last menstrual period 02/15/2023.Body mass index is 45.15 kg/m.  General Appearance: Fairly Groomed  Eye Contact:  Fair  Speech:  Clear and Coherent  Volume:  Normal  Mood:  Anxious and Depressed  Affect:  Full Range  Thought Process:  Goal Directed and Descriptions of Associations: Intact  Orientation:  Full (Time, Place, and Person)  Thought Content: Logical   Suicidal Thoughts:  No   Homicidal Thoughts:  No  Memory:  Immediate;   Fair Recent;   Fair Remote;   Fair  Judgement:  Fair  Insight:  Fair  Psychomotor Activity:  Normal  Concentration:  Concentration: Fair and Attention Span: Fair  Recall:  [employer] of Knowledge: Fair  Language: Fair  Akathisia:  No  Handed:  Right  AIMS (if indicated): not done  Assets:  Communication Skills Desire for Improvement Housing Social Support  ADL's:  Intact  Cognition: WNL  Sleep:  Poor   Screenings: AIMS    Flowsheet Row [facility]  AIMS Total Score 0      GAD-7    Flowsheet Row Office Visit from 02/21/2023 in [facility] Office Visit from 02/15/2023 in [facility] Office Visit from 08/05/2022 in [facility] Office Visit from 05/20/2022 in [facility] Counselor from 05/17/2022 in [facility]  Total GAD-7 Score 10 11 11 12 18       PHQ2-9    Flowsheet Row Office Visit from 02/21/2023 in [facility] Office Visit from 02/15/2023 in [facility] Office Visit from 12/20/2022 in [facility] Office Visit from 08/05/2022 in [facility] Office Visit from 05/20/2022 in [facility]  PHQ-2 Total Score 4 3 1 4 3   PHQ-9 Total Score 16 15 -- 16 13      Flowsheet Row Office Visit from 02/21/2023 in [facility] Office Visit from 12/20/2022 in [facility] Office Visit from 09/14/2022 in [facility]  C-SSRS RISK CATEGORY Moderate Risk No Risk No Risk        Assessment and Plan: Bhawna Burdine is a 22 year old African-American female, has a history of MDD, GAD, and Lissa Hoard, attention and focus deficit, presents with worsening mood symptoms, sleep problems, had recent ADHD testing which ruled out ADHD, discussed plan as noted below.  Plan MDD-unstable Continue Cymbalta 80 mg p.o. daily.  Will consider changing to another SSRI or SNRI in the future. Start BuSpar 5 mg p.o. twice daily Patient has upcoming appointment to start CBT, encouraged  to do so.  GAD-unstable Start BuSpar 5 mg p.o. twice daily Propranolol 10 mg p.o. twice daily as needed Start CBT.  Insomnia-unstable Prazosin 1 mg p.o. nightly for nightmares Discussed addition of a sleep aid, patient would like to try melatonin over-the-counter, also gave information for sleep #3 and sleep #5. Discussed to work on sleep hygiene.  Attention and concentration deficit-I have reviewed notes per Dr. Lindon Romp 01/12/2023-attentional deficit likely due to fatigue and drowsiness developing rather than a pattern that is typically seen with ADHD.  Patient has consistently poor sleep pattern and describes her sleep pattern is all over the place. Patient may also benefit from potentially being evaluated for obstructive sleep apnea.    Collaboration of Care: Collaboration of Care: Referral or follow-up with counselor/therapist AEB patient encouraged to establish care with therapist.  Patient/Guardian was advised Release of Information must be obtained prior to any record release in order to collaborate their care with an outside provider. Patient/Guardian was advised if they have not already done so to contact the registration department to sign all necessary forms in order for [place] to release information regarding their care.   Consent: Patient/Guardian gives verbal consent for treatment and assignment of benefits for services provided during this visit. Patient/Guardian expressed understanding and agreed to proceed.   Follow-up  in clinic in 4 weeks or sooner if needed.  This note was generated in part or whole with voice recognition software. Voice recognition is usually quite accurate but there are transcription errors that can and very often do occur. I apologize for any typographical errors that were not detected and corrected.    Michelle Longs, MD 02/22/2023, 5:25 PM

## 2023-02-21 NOTE — Patient Instructions (Addendum)
Sleep #3 Supplement Facts L- Theanine 200 mg Nighttime Herbal Blend 50 mg Chamomile Extract (Matricarla recutita) (flower), Lavender Extract (Lavandula officinalis) (aerial), Lemon Balm Extract (Melissa officinalis) (leaf), Valerian Root Extract (Valeriana officinalis) (root) Melatonin 10 mg   Sleep#5 - QUNOL Qunol Sleep Support 5 in 1 Description 5 in 1 Time Released Melatonin L-Theanine GABA Aswagandha Valerian Root   Buspirone Tablets What is this medication? BUSPIRONE (byoo SPYE rone) treats anxiety. It works by balancing the levels of dopamine and serotonin in your brain, substances that help regulate mood. This medicine may be used for other purposes; ask your health care provider or pharmacist if you have questions. COMMON BRAND NAME(S): BuSpar, Buspar Dividose What should I tell my care team before I take this medication? They need to know if you have any of these conditions: Kidney or liver disease An unusual or allergic reaction to buspirone, other medications, foods, dyes, or preservatives Pregnant or trying to get pregnant Breast-feeding How should I use this medication? Take this medication by mouth with a glass of water. Follow the directions on the prescription label. You may take this medication with or without food. To ensure that this medication always works the same way for you, you should take it either always with or always without food. Take your doses at regular intervals. Do not take your medication more often than directed. Do not stop taking except on the advice of your care team. Talk to your care team about the use of this medication in children. Special care may be needed. Overdosage: If you think you have taken too much of this medicine contact a poison control center or emergency room at once. NOTE: This medicine is only for you. Do not share this medicine with others. What if I miss a dose? If you miss a dose, take it as soon as you can. If it is  almost time for your next dose, take only that dose. Do not take double or extra doses. What may interact with this medication? Do not take this medication with any of the following: Linezolid MAOIs like Carbex, Eldepryl, Marplan, Nardil, and Parnate Methylene blue Procarbazine This medication may also interact with the following: Diazepam Digoxin Diltiazem Erythromycin Grapefruit juice Haloperidol Medications for mental depression or mood problems Medications for seizures like carbamazepine, phenobarbital and phenytoin Nefazodone Other medications for anxiety Rifampin Ritonavir Some antifungal medications like itraconazole, ketoconazole, and voriconazole Verapamil Warfarin This list may not describe all possible interactions. Give your health care provider a list of all the medicines, herbs, non-prescription drugs, or dietary supplements you use. Also tell them if you smoke, drink alcohol, or use illegal drugs. Some items may interact with your medicine. What should I watch for while using this medication? Visit your care team for regular checks on your progress. It may take 1 to 2 weeks before your anxiety gets better. This medication may affect your coordination, reaction time, or judgment. Do not drive or operate machinery until you know how this medication affects you. Sit up or stand slowly to reduce the risk of dizzy or fainting spells. Drinking alcohol with this medication can increase the risk of these side effects. What side effects may I notice from receiving this medication? Side effects that you should report to your care team as soon as possible: Allergic reactions--skin rash, itching, hives, swelling of the face, lips, tongue, or throat Irritability, confusion, fast or irregular heartbeat, muscle stiffness, twitching muscles, sweating, high fever, seizure, chills, vomiting, diarrhea, which may be  signs of serotonin syndrome Side effects that usually do not require  medical attention (report to your care team if they continue or are bothersome): Anxiety, nervousness Dizziness Drowsiness Headache Nausea Trouble sleeping This list may not describe all possible side effects. Call your doctor for medical advice about side effects. You may report side effects to FDA at 1-800-FDA-1088. Where should I keep my medication? Keep out of the reach of children. Store at room temperature below 30 degrees C (86 degrees F). Protect from light. Keep container tightly closed. Throw away any unused medication after the expiration date. NOTE: This sheet is a summary. It may not cover all possible information. If you have questions about this medicine, talk to your doctor, pharmacist, or health care provider.  2024 Elsevier/Gold Standard (2021-11-01 00:00:00)

## 2023-02-23 ENCOUNTER — Ambulatory Visit (INDEPENDENT_AMBULATORY_CARE_PROVIDER_SITE_OTHER): Payer: BC Managed Care – PPO | Admitting: Licensed Clinical Social Worker

## 2023-02-23 DIAGNOSIS — R41840 Attention and concentration deficit: Secondary | ICD-10-CM | POA: Diagnosis not present

## 2023-02-23 DIAGNOSIS — F331 Major depressive disorder, recurrent, moderate: Secondary | ICD-10-CM

## 2023-02-23 DIAGNOSIS — F411 Generalized anxiety disorder: Secondary | ICD-10-CM

## 2023-02-23 NOTE — Progress Notes (Signed)
Comprehensive Clinical Assessment (CCA) Note  02/23/2023 Michelle Richardson [id number]  Chief Complaint:  Chief Complaint  Patient presents with   Establish Care   Anxiety   Visit Diagnosis: MDD (major depressive disorder), recurrent episode, moderate (HCC)  Generalized anxiety disorder  Attention and concentration deficit    Pt reports functional impairment related to Difficulty with memory, concentration, or problem solving, Difficulty with social cues, getting along with others, Difficulty with planning, organizing, or multitasking, Difficulty with mood/affect regulation.   CCA Biopsychosocial Intake/Chief Complaint:  anxiety, depression  Current Symptoms/Problems: Anxiety: "I don't stop worrying; I don't relax too often"; "sad somedays"   Patient Reported Schizophrenia/Schizoaffective Diagnosis in Past: No   Strengths: arts and doing hair  Preferences: No preferences  Abilities: pt stated that she enjoys painting and sowing   Type of Services Patient Feels are Needed: therapy   Initial Clinical Notes/Concerns: August of 2020 the patient was hopitalized with Behavioral Health for S/I.   Mental Health Symptoms Depression:   Change in energy/activity; Difficulty Concentrating; Fatigue; Worthlessness; Sleep (too much or little); Increase/decrease in appetite; Hopelessness; Weight gain/loss (Seleeping too much and too little)   Duration of Depressive symptoms:  Greater than two weeks   Mania:   Racing thoughts   Anxiety:    Difficulty concentrating; Fatigue; Tension; Worrying; Restlessness; Sleep   Psychosis:   None   Duration of Psychotic symptoms: No data recorded  Trauma:   None   Obsessions:   None   Compulsions:   None   Inattention:   Forgetful; Loses things; Poor follow-through on tasks; Fails to pay attention/makes careless mistakes; Does not follow instructions (not oppositional); Does not seem to listen; Disorganized; Symptoms before age 40;  Symptoms present in 2 or more settings   Hyperactivity/Impulsivity:   Fidgets with hands/feet; Always on the go; Feeling of restlessness; Blurts out answers; Difficulty waiting turn; Symptoms present before age 24; Talks excessively; Several symptoms present in 2 of more settings   Oppositional/Defiant Behaviors:   None   Emotional Irregularity:   Unstable self-image; Mood lability   Other Mood/Personality Symptoms:   The patient is having difficulty managing her mood and staying at baseline.    Mental Status Exam Appearance and self-care  Stature:   Average   Weight:   Average weight   Clothing:   Neat/clean   Grooming:   Normal   Cosmetic use:   Age appropriate   Posture/gait:   Normal   Motor activity:   Not Remarkable   Sensorium  Attention:   Normal   Concentration:   Normal   Orientation:   X5   Recall/memory:   Normal   Affect and Mood  Affect:   Appropriate   Mood:   Euthymic   Relating  Eye contact:   Normal   Facial expression:   Responsive   Attitude toward examiner:   Cooperative   Thought and Language  Speech flow:  Clear and Coherent   Thought content:   Appropriate to Mood and Circumstances   Preoccupation:   None   Hallucinations:   None   Organization:  No data recorded  [employer] of Knowledge:   Average   Intelligence:   Average   Abstraction:   Normal   Judgement:   Normal   Reality Testing:   Realistic   Insight:   Present   Decision Making:   Normal   Social Functioning  Social Maturity:   Responsible   Social Judgement:   Normal  Stress  Stressors:   Relationship; Work   Coping Ability:   Human resources officer Deficits:   Self-care   Supports:   Friends/Service system     Religion: Religion/Spirituality Are You A Religious Person?: Yes What is Your Religious Affiliation?: Christian How Might This Affect Treatment?: Protective  Factor  Leisure/Recreation: Leisure / Recreation Do You Have Hobbies?: Yes Leisure and Hobbies: crafts  Exercise/Diet: Exercise/Diet Do You Exercise?: Yes What Type of Exercise Do You Do?: Run/Walk How Many Times a Week Do You Exercise?: 1-3 times a week Have You Gained or Lost A Significant Amount of Weight in the Past Six Months?: Yes-Lost Number of Pounds Lost?: 40 Do You Follow a Special Diet?: No Do You Have Any Trouble Sleeping?: Yes Explanation of Sleeping Difficulties: The patient identifies an irratic sleep pattern   CCA Employment/Education Employment/Work Situation: Employment / Work Situation Employment Situation: Employed Where is Patient Currently Employed?: [employer] How [place] has Patient Been Employed?: one year Are You Satisfied With Your Job?: Yes Do You Work More Than One Job?: No Work Stressors: Pt. stated that one coworker that is difficult to get along with. Patient's Job has Been Impacted by Current Illness: No What is the Longest Time Patient has Held a Job?: NA Where was the Patient Employed at that Time?: NA Has Patient ever Been in the [employer]?: No  Education: Education Is Patient Currently Attending School?: No Last Grade Completed: 12 Name of High School: [employer] Did [place] Graduate From [employer]?: Yes Did You Attend College?: No Did You Attend Graduate School?: No Did You Have Any Special Interests In School?: None idenitfied Did You Have An Individualized Education Program (IIEP): No Did You Have Any Difficulty At School?: Yes Were Any Medications Ever Prescribed For These Difficulties?: No Patient's Education Has Been Impacted by Current Illness: Yes How Does Current Illness Impact Education?: Difficulty focusing in school.   CCA Family/Childhood History Family and Relationship History: Family history Marital status: Single Are you sexually active?: No What is your sexual orientation?: bi-sexual Has your sexual activity  been affected by drugs, alcohol, medication, or emotional stress?: No alcohol or substance use Does patient have children?: No  Childhood History:  Childhood History By whom was/is the patient raised?: Grandparents, Mother Additional childhood history information: Pt stated that her grandmother raised her with her mother until she was 88 years old until her grandmother died when she was 58 years old. Description of patient's relationship with caregiver when they were a child: Pt stated that her mother worked three jobs and that her grandmother raised her. Pt stated that her father would come see her like once a week for an hour when she was child. Patient's description of current relationship with people who raised him/her: Mom-struggles to establish roles in the household and with raising siblings. Dad-"I have a lot of anger towards him." DSS involvement due to dad allegedly "stalking" her mom. How were you disciplined when you got in trouble as a child/adolescent?: Pt stated that her mother would take her phone and she would get "spanked". Does patient have siblings?: Yes Number of Siblings: 3 Description of patient's current relationship with siblings: pt stated that she is in the middle child and that both of her sisters live in the home with her. Pt. reports a close relationship with both sisters and brothers. Did patient suffer any verbal/emotional/physical/sexual abuse as a child?: No Has patient ever been sexually abused/assaulted/raped as an adolescent or adult?: No Witnessed domestic  violence?: No Has patient been affected by domestic violence as an adult?: No  Child/Adolescent Assessment:     CCA Substance Use Alcohol/Drug Use: Alcohol / Drug Use Pain Medications: None Prescriptions: Lexapro, Wellburtrin, Hydroxcine Over the Counter: None identified History of alcohol / drug use?: No history of alcohol / drug abuse Longest period of sobriety (when/how [place]): pt denies any drug  or alcohol use                         ASAM's:  Six Dimensions of Multidimensional Assessment  Dimension 1:  Acute Intoxication and/or Withdrawal Potential:      Dimension 2:  Biomedical Conditions and Complications:      Dimension 3:  Emotional, Behavioral, or Cognitive Conditions and Complications:     Dimension 4:  Readiness to Change:     Dimension 5:  Relapse, Continued use, or Continued Problem Potential:     Dimension 6:  Recovery/Living Environment:     ASAM Severity Score:    ASAM Recommended Level of Treatment:     Substance use Disorder (SUD)    Recommendations for Services/Supports/Treatments: Recommendations for Services/Supports/Treatments Recommendations For Services/Supports/Treatments: Individual Therapy  DSM5 Diagnoses: Patient Active Problem List   Diagnosis Date Noted   Prediabetes 02/15/2023   Iron deficiency anemia due to chronic blood loss 02/15/2023   Insomnia 12/21/2022   Recurrent major depressive disorder, in full remission (HCC) 12/21/2022   Attention and concentration deficit 03/31/2022   Morbid obesity (HCC) 01/05/2022   Vitamin D deficiency 01/05/2022   Asthma, mild intermittent, poorly controlled 01/05/2022   Generalized anxiety disorder 04/16/2019   Moderate episode of recurrent major depressive disorder (HCC) 04/16/2019   Patient is a 22 year old African-American female who presents alone to [facility] for her intake appointment.  Patient was previously engaged in therapeutic treatment with former therapist, Trula Ore Hussami.  Patient is actively engaged in psychiatric services with Dr. Mariam Dollar.  Patient presents with mixed symptoms of anxiety, depression, and bereavement.  Patient cites difficulties with consistent episodes of sleep reporting frequent nightmares where other people are dying or she is surrounded by individuals restating negative statements about her and it does not end until she completes suicide herself.  Patient reports  these nightmares have been occurring for a while and happen a couple times a week.  Patient reports increased appetite, feelings of failure, restlessness, anhedonia, blue mood, uncontrollable worry, anxious feelings.   Patient identifies stressors to include confrontation with peers and feeling and excepted.  Patient reports she frequently has thoughts such as, "I am not enough."Patient identifies support system of family and friends but shares she often struggles with social cues and exercising emotional or physical affection with peers.  Patient is aware she has difficulty trusting others and letting people get close to her.  Patient identifies she has unprocessed grief from when her grandmother passed away when she was 89 she shares she is uncomfortable talking about her grandparents due to fear of becoming tearful and will often avoid talking about them altogether for fear of discussing their passing.  Clinician and patient also processed role reversal in the household between her and her mom.  Patient feels the need to step into the caregiver role for her younger siblings due to past childhood incidents.  Patient identifies she can successfully set boundaries in her personal life but struggles to demonstrate appropriate boundaries in the home.   Patient reports history of inpatient mental health care for suicidal ideations when she  was in high school. Patient denies SI, HI, AVH.  Patient's identified goals for treatment include: Increasing self-confidence and working towards self-love as well as communicating expectation with her mother, and processing grief from grandparents passing when she was a child.   Patient Centered Plan: Patient is on the following Treatment Plan(s):  Anxiety and Depression and Social Interpersonal connections    Referrals to Alternative Service(s): Referred to Alternative Service(s):   Place:   Date:   Time:    Referred to Alternative Service(s):   Place:   Date:    Time:    Referred to Alternative Service(s):   Place:   Date:   Time:    Referred to Alternative Service(s):   Place:   Date:   Time:      Collaboration of Care: AEB psychiatrist can access notes and cln. Will review psychiatrists' notes. Check in with the patient and will see LCSW per availability. Patient agreed with treatment recommendations. Pt. is scheduled for a follow-up November 8th.   Patient/Guardian was advised Release of Information must be obtained prior to any record release in order to collaborate their care with an outside provider. Patient/Guardian was advised if they have not already done so to contact the registration department to sign all necessary forms in order for [place] to release information regarding their care.   Consent: Patient/Guardian gives verbal consent for treatment and assignment of benefits for services provided during this visit. Patient/Guardian expressed understanding and agreed to proceed.   Dereck Leep, LCSW

## 2023-02-24 ENCOUNTER — Other Ambulatory Visit: Payer: Self-pay | Admitting: Psychiatry

## 2023-02-24 DIAGNOSIS — F411 Generalized anxiety disorder: Secondary | ICD-10-CM

## 2023-03-02 ENCOUNTER — Ambulatory Visit: Payer: BC Managed Care – PPO | Admitting: Licensed Clinical Social Worker

## 2023-03-02 DIAGNOSIS — F3341 Major depressive disorder, recurrent, in partial remission: Secondary | ICD-10-CM | POA: Diagnosis not present

## 2023-03-02 DIAGNOSIS — F411 Generalized anxiety disorder: Secondary | ICD-10-CM

## 2023-03-02 DIAGNOSIS — R41840 Attention and concentration deficit: Secondary | ICD-10-CM

## 2023-03-02 NOTE — Progress Notes (Signed)
THERAPIST PROGRESS NOTE  Session Time: 10:05-11:05am  Participation Level: Active  Behavioral Response: CasualAlertAnxious  Type of Therapy: Individual Therapy  Treatment Goals addressed: LTG: Reduce frequency, intensity, and duration of depression symptoms so that daily functioning is improved   ProgressTowards Goals: Progressing  Interventions: CBT, Supportive, and Reframing  Summary: Michelle Richardson is a 22 y.o. female who presents with mixed symptoms of anxiety and depression.  Patient identifies feeling negative self affect, low mood, uncontrollable worry, nervousness, racing thoughts.  Patient arrives on time maintains appropriate eye contact throughout the session.  Patient utilized therapeutic space to process feelings related to navigating personal relationships and reflecting on internal conflict related to understanding personal identity.  Patient began session by reflecting on her past history of prioritizing education over social relationships, which has resulted in her feeling she sabotages social relationships because it is safer to push everybody away and hide her feelings.  Patient reflected on childhood messages around safety to share feelings.  Patient states "I am sad "and "I have been by myself for so Rachel."  Patient reflected on the few times she felt safe to share her emotions and cry in front of somebody, identifying she used to cry in front of her mom about her nightmares.  Through gentle reminders from the clinician, patient was able to identify her tendency to state "I am fine "as a way to minimize her feelings and deal with discomfort around sharing feelings.  Patient reflected on tendencies to avoid serious events/conversations identifying she often prioritizes sympathizing with others over her feelings.  Patient states "I do not want anybody to feel left out because I am always left out."  Patient reflected on previous hospitalization for suicide attempt back in 2020.   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  Patient reflected on concerns that she does not truly know who she is stating "I am always performing" AEB hiding her accident, putting up defenses, feeling uncomfortable with knowing who she is.  Patient reports she feels she is advancing in life professionally, but expresses concerns about her social and mental development.    Homework: Patient agreed to practice guided meditation 3 times this week to improve levels of comfort with sitting with herself and being present.  Suicidal/Homicidal: Nowithout intent/plan  Therapist Response: Cln. utilized active and reflective listening to evaluate patient's symptoms and safety since last session.  Therapist explored client's core beliefs and schemas around sharing feelings and letting others in.  Therapist introduced acceptance and commitment therapy techniques to practice cognitive defusion.  Cln introduced patient to mindfulness resources in order to practice being present while quieting racing thoughts.  Cln assess clients level of self-care observing patient is in need of therapist assisted client in identifying and challenging cognitive distortions as well as assisted in exploring identity. Therapist will continue to assist client in developing self identity outside of the role/masks she identifies she carries.  Plan: Return again in 1 week.  Diagnosis: Recurrent major depressive disorder, in partial remission (HCC)  Generalized anxiety disorder  Attention and concentration deficit   Collaboration of Care: Psychiatrist AEB   psychiatrist can access notes and cln. Will review psychiatrists' notes. Check in with the patient and will see LCSW per availability. Patient agreed with treatment recommendations. Pt. is scheduled for a follow-up in one week.   Patient/Guardian was advised Release of Information must be obtained prior to any  record release in order to  collaborate their care with an outside provider. Patient/Guardian was advised if they have not already done so to contact the registration department to sign all necessary forms in order for [place] to release information regarding their care.   Consent: Patient/Guardian gives verbal consent for treatment and assignment of benefits for services provided during this visit. Patient/Guardian expressed understanding and agreed to proceed.   Dereck Leep, LCSW 03/02/2023

## 2023-03-07 ENCOUNTER — Other Ambulatory Visit: Payer: Self-pay | Admitting: Psychiatry

## 2023-03-07 DIAGNOSIS — F3342 Major depressive disorder, recurrent, in full remission: Secondary | ICD-10-CM

## 2023-03-07 DIAGNOSIS — F411 Generalized anxiety disorder: Secondary | ICD-10-CM

## 2023-03-08 ENCOUNTER — Ambulatory Visit: Payer: BC Managed Care – PPO | Admitting: Licensed Clinical Social Worker

## 2023-03-08 DIAGNOSIS — F3341 Major depressive disorder, recurrent, in partial remission: Secondary | ICD-10-CM | POA: Diagnosis not present

## 2023-03-08 DIAGNOSIS — F411 Generalized anxiety disorder: Secondary | ICD-10-CM

## 2023-03-08 DIAGNOSIS — R41840 Attention and concentration deficit: Secondary | ICD-10-CM | POA: Diagnosis not present

## 2023-03-08 NOTE — Progress Notes (Signed)
THERAPIST PROGRESS NOTE  Session Time: 10:01am-10:59am  Participation Level: Active  Behavioral Response: CasualAlertAnxious  Type of Therapy: Individual Therapy  Treatment Goals addressed: LTG: Reduce frequency, intensity, and duration of depression symptoms so that daily functioning is improved  Develop healthy thinking patterns about self, others and beliefs about self to help alleviate symptoms of depression per pt report 3 out 5 sessions.   ProgressTowards Goals: Progressing  Interventions: Motivational Interviewing, Solution Focused, and Supportive  Summary: Michelle Richardson is a 22 y.o. female who presents with symptoms of anxiety and depression.  Patient oriented x 5.  Patient was engaged and cooperative.  Patient denies [id number].   Patient utilized therapeutic session to process feelings of sadness based on recent events.  She identifies "I have been bawling my eyes out."  Patient reflected on issues with family citing she feels disrespected.  Patient identified financial stressors and recent stressors regarding relationship with peer.  Processed feelings of misplaced guilt as a result of establishing boundaries for relationship.  Identified negative cognitions such as "I do not matter "and "I am not good enough."  Addressed root of these negative cognitions and worked to reframe them.  Identified she feels a solution would be to hear that her mom is proud of her.  Processed her role in the family system and identified what she can and cannot control.  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  Through motivational interviewing identified she is at an 8 out of 10 motivated if she is not working that day.  Utilized solution focused treatment to identify barriers.  Patient identified she will go to external locations outside of the home to engage in her homework of checking application requirements for college.  Suicidal/Homicidal: Nowithout  intent/plan  Therapist Response: Clinician utilized active and reflective listening to create a safe and supportive environment for patient to process recent symptoms, stressors, and safety since last session. Empowered patient to develop self efficacy and establish healthy boundaries with family members.  Reviewed homework of practicing bilateral tapping to address anxious feelings.  Patient reports she did not find these coping skills helpful and has since deleted the app.  Therapist explored client's experience within her relationship with her mother.  Therapist addressed the impact of feeling compared to her siblings.  Therapist employed cognitive behavioral techniques to identify and challenge unhelpful thoughts.  Therapist used motivational interviewing to assess and enhance patient's motivation for change regarding her living placement.  Therapist utilized solution focused therapy to identify and challenge barriers to completing college application.  Plan: Return again in 1 week.  Diagnosis:Recurrent major depressive disorder, in partial remission (HCC)  Generalized anxiety disorder  Attention and concentration deficit   Collaboration of Care: Psychiatrist AEB   psychiatrist can access notes and cln. Will review psychiatrists' notes. Check in with the patient and will see LCSW per availability. Patient agreed with treatment recommendations. Pt. is scheduled for a follow-up ____.    Patient/Guardian was advised Release of Information must be obtained prior to any record release in order to collaborate their care with an outside provider. Patient/Guardian was advised if they have not already done so to contact the registration department to sign all necessary forms in order for [place] to release information regarding their care.   Consent: Patient/Guardian gives verbal consent for treatment and assignment of benefits for services provided during this visit. Patient/Guardian expressed understanding  and agreed to proceed.   Dereck Leep, LCSW 03/08/2023

## 2023-03-16 ENCOUNTER — Ambulatory Visit: Payer: BC Managed Care – PPO | Admitting: Licensed Clinical Social Worker

## 2023-03-16 DIAGNOSIS — F411 Generalized anxiety disorder: Secondary | ICD-10-CM

## 2023-03-16 DIAGNOSIS — F3341 Major depressive disorder, recurrent, in partial remission: Secondary | ICD-10-CM

## 2023-03-16 NOTE — Progress Notes (Addendum)
   THERAPIST PROGRESS NOTE  Session Time: 10:02-10:52am  Participation Level: Active  Behavioral Response: CasualAlertEuthymic  Type of Therapy: Individual Therapy  Treatment Goals addressed: Pt stated that she wants to cope with her anxiety   LTG: Michelle Richardson will recognize socially inappropriate behaviors and develop alternative behaviors  Learn three ways to communicate verbally when angry per pt report 3 out of 5 documented sessions.  ProgressTowards Goals: Progressing  Interventions: CBT, Solution Focused, Strength-based, and Supportive  Summary: Michelle Richardson is a 22 y.o. female who presents with anxiety and depression.  Patient cites symptoms of uncontrollable worry, irritability, isolation, negative self affect.  Patient oriented x 5.  Patient was cooperative engaged.  Patient denies SI/HI/AVH.  Patient utilized therapeutic space to process recent life stressors and disagreements with family and friends.  Patient states "I am just all right this week."  Reflected on recent altercation with her mother in the process of setting boundaries with her mom.  Patient reflected on aggressive communication and processed ways she can work towards assertive communication.  Patient expressed pride in her ability to communicate confusion related to her responsibilities in the home.  Patient reflected on dispute with best friend and patterns of prioritizing others comfort.  Challenged thoughts about responsibilities and level of caring for others.  Processed ways she can continue to have conversations related to expectations and friendship.  Explored patient's feelings related to others' expectations for how she would act and the metaphor that she feels she is putting on mask to please others.  Patient explored her understanding of her identity.  Patient reports she does not feel safe anywhere to let her mask down and truly understand who she is.  Patient identifies her safe place is her truck.  Patient  reports she feels lonely more than any other feeling.  Patient identifies she feels she is at a place where she can choose to take down her mask and explore who she truly is as an individual.  Homework: Patient was provided values exploration assignments to complete in preparation for next session.  Patient explored her understanding of her safe identitySuicidal/Homicidal: Nowithout intent/plan  Therapist Response: Clinician utilized supportive and active listening to provide a safe environment for patient to explore recent life stressors and symptoms.Therapist guided patient through cognitive restructuring exercises and challenging negative thoughts about identity exploration.  Identified and assessed readiness to change in goals for self discovery.  Encouraged patient to establish healthy boundaries.  Challenged patient to explore alternative perspectives and take risk and personal growth.  Encouraged patient to break the cycle of prioritizing the comfort of others at her own demise.  Plan: Return again in 1 week.  Diagnosis: Recurrent major depressive disorder, in partial remission (HCC)  Generalized anxiety disorder   Collaboration of Care: AEB psychiatrist can access notes and cln. Will review psychiatrists' notes. Check in with the patient and will see LCSW per availability. Patient agreed with treatment recommendations.   Patient/Guardian was advised Release of Information must be obtained prior to any record release in order to collaborate their care with an outside provider. Patient/Guardian was advised if they have not already done so to contact the registration department to sign all necessary forms in order for [place] to release information regarding their care.   Consent: Patient/Guardian gives verbal consent for treatment and assignment of benefits for services provided during this visit. Patient/Guardian expressed understanding and agreed to proceed.   Dereck Leep,  LCSW 03/16/2023

## 2023-03-22 ENCOUNTER — Ambulatory Visit: Payer: BC Managed Care – PPO | Admitting: Licensed Clinical Social Worker

## 2023-03-22 DIAGNOSIS — F3341 Major depressive disorder, recurrent, in partial remission: Secondary | ICD-10-CM

## 2023-03-22 DIAGNOSIS — F411 Generalized anxiety disorder: Secondary | ICD-10-CM | POA: Diagnosis not present

## 2023-03-22 NOTE — Progress Notes (Signed)
   THERAPIST PROGRESS NOTE  Session Time: 10:00am-10:57am   Participation Level: Active  Behavioral Response: CasualAlertEuthymic  Type of Therapy: Individual Therapy  Treatment Goals addressed: LTG: Michelle Richardson will recognize socially inappropriate behaviors and develop alternative behaviors  Learn three ways to communicate verbally when angry per pt report 3 out of 5 documented sessions.   ProgressTowards Goals: Progressing  Interventions: CBT, Strength-based, and Supportive  Summary: Michelle Richardson is a 22 y.o. female who presents with sxs of anxiety and depression. Pt identifies sxs to include uncontrollable worry, tearfulness, physical "tics", and isolation. Patient oriented x 5. Patient was cooperative engaged. Patient denies SI/HI/AVH.   Patient utilized therapeutic space to process recent life stressors.  Clinician and patient reviewed previous homework regarding values exploration.  Patient processed the difference in values within those who she considers to be her support system.  Patient expressed surprise regarding similarities of values with her father.  Processed relationship dynamics with her mother.  Reflected on similarities and differences between her personality traits and her mothers.  Continued to reestablish healthy boundaries and encourage patient to advocate for her needs.  Patient reflected on her values and discussed ways in which her empathic behavior is reflected in recent life events.  Clinician supported patient and helping her become aware of dismissive behaviors/minimization of her feelings.  Clinician also walked patient through CBT techniques to reframe unhelpful thoughts regarding the safety of patient sharing her feelings and being vulnerable.  Patient expressed concerns about physical tics following feelings of stress and anxiety.  Patient identifies every other day she notices twitches of her neck, hard blinking, and eyebrow twitches brought on by feelings of  nervousness.  Patient cites his last episode of physical tics was yesterday at work.  Clinician encouraged patient to address this and upcoming appointment with psychiatrist and with PCP.  Clinician will continue to explore this reaction to feelings of stress and anxiety.  Suicidal/Homicidal: Nowithout intent/plan  Therapist Response: Clinician utilized supportive and active listening to provide a safe environment for patient to explore recent life stressors and symptoms. Cln. Utilized a strengths based approach to assist patient in identifying and reflecting on personal values as well as values instilled in her from various locations. Cln observed negative cognitions and pt.'s pattern to minimize her feelings. Cln exercised CBT techniques to assist in teaching pt about these patterns and reframing unhelpful thought patterns.  Plan: Return again in 1 week.  Diagnosis: Recurrent major depressive disorder, in partial remission (HCC)  Generalized anxiety disorder   Collaboration of Care: AEB psychiatrist can access notes and cln. Will review psychiatrists' notes. Check in with the patient and will see LCSW per availability. Patient agreed with treatment recommendations.  Patient/Guardian was advised Release of Information must be obtained prior to any record release in order to collaborate their care with an outside provider. Patient/Guardian was advised if they have not already done so to contact the registration department to sign all necessary forms in order for [place] to release information regarding their care.   Consent: Patient/Guardian gives verbal consent for treatment and assignment of benefits for services provided during this visit. Patient/Guardian expressed understanding and agreed to proceed.   Dereck Leep, LCSW 03/22/2023

## 2023-03-23 ENCOUNTER — Other Ambulatory Visit: Payer: Self-pay | Admitting: Psychiatry

## 2023-03-23 DIAGNOSIS — F3342 Major depressive disorder, recurrent, in full remission: Secondary | ICD-10-CM

## 2023-03-24 ENCOUNTER — Other Ambulatory Visit: Payer: Self-pay | Admitting: Psychiatry

## 2023-03-24 DIAGNOSIS — F411 Generalized anxiety disorder: Secondary | ICD-10-CM

## 2023-03-24 DIAGNOSIS — F3342 Major depressive disorder, recurrent, in full remission: Secondary | ICD-10-CM

## 2023-03-27 ENCOUNTER — Ambulatory Visit: Payer: BC Managed Care – PPO | Admitting: Licensed Clinical Social Worker

## 2023-03-27 DIAGNOSIS — F411 Generalized anxiety disorder: Secondary | ICD-10-CM

## 2023-03-27 DIAGNOSIS — F3341 Major depressive disorder, recurrent, in partial remission: Secondary | ICD-10-CM

## 2023-03-27 NOTE — Progress Notes (Signed)
   THERAPIST PROGRESS NOTE  Session Time: 9:01am-9:52am  Participation Level: Active  Behavioral Response: CasualAlertEuthymic  Type of Therapy: Individual Therapy  Treatment Goals addressed: LTG: [name] will recognize socially inappropriate behaviors and develop alternative behaviors  Learn three ways to communicate verbally when angry per pt report 3 out of 5 documented sessions.   ProgressTowards Goals: Progressing  Interventions: Strength-based, Conservator, museum/gallery, Supportive, and Social Skills Training  Summary: Michelle Richardson is a 22 y.o. female who presents with symptoms of anxiety and depression.  Patient identifies symptoms to include uncontrollable worry, isolation, negative self affect. Pt was oriented times 5. Pt was cooperative and engaged. Pt denies SI/HI/AVH.     Patient utilized therapeutic space to process holiday season and recent dispute with siblings.  Continued to work with clinician to process ways in which she can set boundaries.  Patient identified she has been able to continue to reflect on her personal values but identifies she still desires validation from others.  Patient reports the only time she has ever felt seen in her life is by her grandmother.  Discussed how this has contributed to current dynamics with and feelings of resentment that she holds towards her parents.  Patient continue to process difficulties with trust.  Clinician provided praise as patient is actively reflecting on the "masks "she feels she has to wear around others and identifying ways in which her masks are interfering with her ability to work towards self discovery.  Suicidal/Homicidal: Nowithout intent/plan  Therapist Response: Patient utilized active and supportive reflection to create a safe space for patient to process recent life stressors and symptoms.  Challenged ways in which patient can continue to set healthy boundaries within her life.  Utilized strength-based techniques to  assist patient and reflecting on current use of values.  Reviewed assertive communication techniques and challenged patient to identify the ways in which she can utilize assertive communication.  Plan: Return again in 1 week.  Diagnosis: Recurrent major depressive disorder, in partial remission (HCC)  Generalized anxiety disorder   Collaboration of Care: AEB psychiatrist can access notes and cln. Will review psychiatrists' notes. Check in with the patient and will see LCSW per availability. Patient agreed with treatment recommendations.  Patient/Guardian was advised Release of Information must be obtained prior to any record release in order to collaborate their care with an outside provider. Patient/Guardian was advised if they have not already done so to contact the registration department to sign all necessary forms in order for [place] to release information regarding their care.   Consent: Patient/Guardian gives verbal consent for treatment and assignment of benefits for services provided during this visit. Patient/Guardian expressed understanding and agreed to proceed.   Dereck Leep, LCSW 03/27/2023

## 2023-03-29 ENCOUNTER — Encounter: Payer: Self-pay | Admitting: Psychiatry

## 2023-03-29 ENCOUNTER — Ambulatory Visit: Payer: BC Managed Care – PPO | Admitting: Psychiatry

## 2023-03-29 VITALS — BP 126/73 | HR 88 | Temp 97.2°F | Ht 69.5 in | Wt 311.4 lb

## 2023-03-29 DIAGNOSIS — F411 Generalized anxiety disorder: Secondary | ICD-10-CM | POA: Diagnosis not present

## 2023-03-29 DIAGNOSIS — F3341 Major depressive disorder, recurrent, in partial remission: Secondary | ICD-10-CM

## 2023-03-29 DIAGNOSIS — F5101 Primary insomnia: Secondary | ICD-10-CM | POA: Diagnosis not present

## 2023-03-29 NOTE — Progress Notes (Signed)
BH MD OP Progress Note  03/29/2023 5:11 PM Michelle Richardson  MRN:  [id number]  Chief Complaint:  Chief Complaint  Patient presents with   Follow-up   Depression   Anxiety   Medication Refill   Night mares   HPI: Michelle Richardson is a 22 year old African-American female, lives in [place] with her mother, currently works at [employer], graduated from [employer], has a history of MDD, GAD, insomnia was evaluated in office today.  The patient presents with increased anxiety symptoms. She reports feeling on edge and nervous, with no identifiable trigger. This heightened anxiety has been noticeable since the initiation of therapy, where she has been discussing suppressed feelings and experiences. The patient describes an incident at work where she felt overwhelmed and needed to step out due to escalating anxiety. She also reports feeling restless and frustrated, struggling to regain focus after such episodes.  The patient has been prescribed Cymbalta, Buspar, and Propranolol for her anxiety. However, she expresses reluctance to take Propranolol regularly due to a fear of becoming dependent  She also reports experiencing nightmares three times a week, which she has come to accept as normal. The patient is prescribed Prazosin for these nightmares but uses it infrequently, due to the discomfort with the absence of dreams.  In addition to anxiety, the patient is dealing with sleep disturbances. She reports waking up frequently during the night, not due to nightmares but seemingly without cause. She also mentions sleeping more during the day than she used to. The patient acknowledges that her sleep quality may be affecting her mood and concentration during the day.  The patient is actively engaged in therapy and has been assigned various tasks, such as expressing her values and exploring an app to help express her feelings. She finds these tasks helpful, particularly in expressing her feelings, but reports getting bored  with the app. She is also in the process of completing an [employer] application in sections, as advised by her therapist, due to her fear of jumping straight into it. Despite the increased anxiety, the patient acknowledges the positive effects of therapy and is committed to continuing it.  Patient currently denies any suicidality, homicidality or perceptual disturbances.  Patient denies any side effects to medications.  Visit Diagnosis:    ICD-10-CM   1. Recurrent major depressive disorder, in partial remission (HCC)  F33.41     2. Generalized anxiety disorder  F41.1     3. Primary insomnia  F51.01       Past Psychiatric History: I have reviewed past psychiatric history from progress note on 03/30/2022. Patient completed ADHD testing per Dr. Lindon Romp 01/12/2023-patient does not meet criteria for ADHD.  Patient may benefit from testing for sleep apnea.   Past Medical History:  Past Medical History:  Diagnosis Date   Asthma    Pediatric obesity    Suicide ideation 01/11/2019    Past Surgical History:  Procedure Laterality Date   TONSILLECTOMY AND ADENOIDECTOMY  08/02/2008    Family Psychiatric History: I have reviewed family psychiatric history from progress note on 03/30/2022.  Family History:  Family History  Problem Relation Age of Onset   Hypertension Mother    Drug abuse Father    Autism spectrum disorder Sister     Social History: I have reviewed social history from progress note on 03/30/2022. Social History   Socioeconomic History   Marital status: Single    Spouse name: Not on file   Number of children: 0   Years of  education: Not on file   Highest education level: High school graduate  Occupational History   Not on file  Tobacco Use   Smoking status: Never   Smokeless tobacco: Never  Vaping Use   Vaping status: Never Used  Substance and Sexual Activity   Alcohol use: Never   Drug use: Never   Sexual activity: Not Currently    Birth  control/protection: Abstinence  Other Topics Concern   Not on file  Social History Narrative   Not on file   Social Determinants of Health   Financial Resource Strain: Low Risk  (02/15/2023)   Overall Financial Resource Strain (CARDIA)    Difficulty of Paying Living Expenses: Not hard at all  Food Insecurity: No Food Insecurity (02/15/2023)   Hunger Vital Sign    Worried About Running Out of Food in the Last Year: Never true    Ran Out of Food in the Last Year: Never true  Transportation Needs: No Transportation Needs (02/15/2023)   PRAPARE - Administrator, Civil Service (Medical): No    Lack of Transportation (Non-Medical): No  Physical Activity: Sufficiently Active (02/15/2023)   Exercise Vital Sign    Days of Exercise per Week: 5 days    Minutes of Exercise per Session: 120 min  Stress: Stress Concern Present (02/15/2023)   [employer] of Occupational Health - Occupational Stress Questionnaire    Feeling of Stress : Rather much  Social Connections: Moderately Isolated (02/15/2023)   Social Connection and Isolation Panel [NHANES]    Frequency of Communication with Friends and Family: More than three times a week    Frequency of Social Gatherings with Friends and Family: Twice a week    Attends Religious Services: More than 4 times per year    Active Member of Clubs or Organizations: No    Attends Engineer, structural: Never    Marital Status: Never married    Allergies: No Known Allergies  Metabolic Disorder Labs: Lab Results  Component Value Date   HGBA1C 6.0 (H) 02/15/2023   MPG 126 02/15/2023   MPG 120 01/06/2022   Lab Results  Component Value Date   PROLACTIN 8.3 02/15/2023   PROLACTIN 11.1 01/06/2022   Lab Results  Component Value Date   CHOL 144 02/15/2023   TRIG 74 02/15/2023   HDL 60 02/15/2023   CHOLHDL 2.4 02/15/2023   VLDL 12 01/11/2019   LDLCALC 69 02/15/2023   LDLCALC 80 01/06/2022   Lab Results  Component Value  Date   TSH 0.73 01/06/2022   TSH 0.863 01/11/2019    Therapeutic Level Labs: No results found for: "LITHIUM" No results found for: "VALPROATE" No results found for: "CBMZ"  Current Medications: Current Outpatient Medications  Medication Sig Dispense Refill   busPIRone (BUSPAR) 5 MG tablet TAKE 1 TABLET BY MOUTH TWICE A DAY 180 tablet 0   DULoxetine (CYMBALTA) 20 MG capsule TAKE 1 CAPSULE (20 MG TOTAL) BY MOUTH DAILY. TAKE ALONG WITH 60 MG DAILY - TOTAL OF 80 MG DAILY 90 capsule 0   DULoxetine (CYMBALTA) 60 MG capsule TAKE 1 CAPSULE (60 MG TOTAL) BY MOUTH DAILY. TAKE ALONG WITH 20 MG DAILY 90 capsule 0   fluticasone-salmeterol (WIXELA INHUB) 250-50 MCG/ACT AEPB INHALE 1 PUFF INTO THE LUNGS IN THE MORNING AND AT BEDTIME. 180 each 3   prazosin (MINIPRESS) 1 MG capsule TAKE 1 CAPSULE BY MOUTH EVERYDAY AT BEDTIME 90 capsule 0   propranolol (INDERAL) 10 MG tablet TAKE 1 TABLET BY  MOUTH 2 TIMES DAILY AS NEEDED. FOR ANXIETY 180 tablet 0   Vitamin D, Ergocalciferol, (DRISDOL) 1.25 MG (50000 UNIT) CAPS capsule Take 1 capsule (50,000 Units total) by mouth every 7 (seven) days. Please take every Thursday starting 01/17/2019 12 capsule 1   No current facility-administered medications for this visit.     Musculoskeletal: Strength & Muscle Tone: within normal limits Gait & Station: normal Patient leans: N/A  Psychiatric Specialty Exam: Review of Systems  Psychiatric/Behavioral:  Positive for dysphoric mood and sleep disturbance. The patient is nervous/anxious.     Blood pressure 126/73, pulse 88, temperature (!) 97.2 F (36.2 C), temperature source Skin, height 5' 9.5" (1.765 m), weight (!) 311 lb 6.4 oz (141.3 kg).Body mass index is 45.33 kg/m.  General Appearance: Fairly Groomed  Eye Contact:  Fair  Speech:  Clear and Coherent  Volume:  Normal  Mood:  Anxious and Depressed  Affect:  Appropriate  Thought Process:  Goal Directed and Descriptions of Associations: Intact  Orientation:  Full  (Time, Place, and Person)  Thought Content: Logical   Suicidal Thoughts:  No  Homicidal Thoughts:  No  Memory:  Immediate;   Fair Recent;   Fair Remote;   Fair  Judgement:  Fair  Insight:  Fair  Psychomotor Activity:  Normal  Concentration:  Concentration: Fair and Attention Span: Fair  Recall:  [employer] of Knowledge: Fair  Language: Fair  Akathisia:  No  Handed:  Right  AIMS (if indicated): not done  Assets:  Desire for Improvement Housing Social Support Transportation  ADL's:  Intact  Cognition: WNL  Sleep:  Poor   Screenings: AIMS    Flowsheet Row [facility]  AIMS Total Score 0      GAD-7    Flowsheet Row Office Visit from 03/29/2023 in [facility] Counselor from 02/23/2023 in [facility] Office Visit from 02/21/2023 in [facility] Office Visit from 02/15/2023 in [facility] Office Visit from 08/05/2022 in [facility]  Total GAD-7 Score 13 9 10 11 11       PHQ2-9    Flowsheet Row Office Visit from 03/29/2023 in [facility] Counselor from 02/23/2023 in [facility] Office Visit from 02/21/2023 in [facility] Office Visit from 02/15/2023 in [facility] Office Visit from 12/20/2022 in [facility]  PHQ-2 Total Score 4 4 4 3 1   PHQ-9 Total Score 13 16 16 15  --      Flowsheet Row Office Visit from 03/29/2023 in [facility] Counselor from 02/23/2023 in [facility] Office Visit from 02/21/2023 in [facility]  C-SSRS  RISK CATEGORY Moderate Risk Error: Q7 should not be populated when Q6 is No Moderate Risk        Assessment and Plan: Michelle Richardson is a 22 year old African-American female who has a history of MDD, GAD, insomnia, was evaluated in office today, presents with worsening mood symptoms due to initiation of therapy and talking about suppressed emotions, discussed plan as noted below.   Generalized Anxiety Disorder-unstable Increased anxiety since starting therapy, affecting daily functioning and work performance. Propranolol 10 mg as needed underutilized due to concerns about dependency. Discussed non-habit forming nature and safety of propranolol.  Currently on Cymbalta 60 mg at night and 20 mg in the morning, and Buspar 5 mg twice daily. Prefers not to increase Buspar dosage at this time. - Encourage use of propranolol 10 mg as needed during high anxiety periods - Continue Cymbalta 60 mg at night and 20 mg in the morning - Continue Buspar 5 mg twice daily - Discuss anxiety symptoms and therapy impact with therapist - Consider increasing Buspar to 10 mg twice daily if symptoms persist  Major Depressive Disorder-unstable Depression screening shows improvement. Prefers to maintain current regimen of Cymbalta and Buspar without dosage changes. - Continue current dosage of Cymbalta and Buspar - Monitor for changes in depressive symptoms - Follow up with therapist regularly  Insomnia-unstable Frequent nocturnal awakenings without nightmares, feeling unrested. Nightmares occur approximately three times a week. Prazosin used sporadically due to discomfort with dream suppression. Discussed importance of consistent prazosin use to improve sleep quality and daytime functioning. Prefers some dream activity over none. - Encourage regular use of prazosin 1 mg at bedtime, especially during workdays - Monitor sleep quality and daytime functioning - Discuss sleep hygiene and coping strategies with  therapist  Follow-up - Schedule follow-up appointment for the last week of February - Contact if medication adjustments are needed before the next appointment.   Collaboration of Care: Collaboration of Care: Referral or follow-up with counselor/therapist AEB patient encouraged to continue CBT.  Patient/Guardian was advised Release of Information must be obtained prior to any record release in order to collaborate their care with an outside provider. Patient/Guardian was advised if they have not already done so to contact the registration department to sign all necessary forms in order for [place] to release information regarding their care.   Consent: Patient/Guardian gives verbal consent for treatment and assignment of benefits for services provided during this visit. Patient/Guardian expressed understanding and agreed to proceed.   This note was generated in part or whole with voice recognition software. Voice recognition is usually quite accurate but there are transcription errors that can and very often do occur. I apologize for any typographical errors that were not detected and corrected.    Jomarie Longs, MD 03/31/2023, 7:50 AM

## 2023-04-03 ENCOUNTER — Ambulatory Visit: Payer: BC Managed Care – PPO | Admitting: Licensed Clinical Social Worker

## 2023-04-03 DIAGNOSIS — F411 Generalized anxiety disorder: Secondary | ICD-10-CM

## 2023-04-03 DIAGNOSIS — F3341 Major depressive disorder, recurrent, in partial remission: Secondary | ICD-10-CM

## 2023-04-03 NOTE — Progress Notes (Signed)
   THERAPIST PROGRESS NOTE  Session Time: 9:00am-10:00am  Participation Level: Active  Behavioral Response: CasualAlertAnxious  Type of Therapy: Individual Therapy  Treatment Goals addressed: LTG: Genesis will recognize socially inappropriate behaviors and develop alternative behaviors  Learn three ways to communicate verbally when angry per pt report 3 out of 5 documented sessions.   ProgressTowards Goals: Progressing  Interventions: Solution Focused, Assertiveness Training, and Supportive  Summary: Michelle Richardson is a 22 y.o. female who presents with symptoms of anxiety and depression.  Patient identifies symptoms to include negative self affect, low mood, anxious feelings, physical tics, isolation. Pt was oriented times 5. Pt was cooperative and engaged. Pt denies SI/HI/AVH.     Patient utilized therapeutic space to process current relationships.  Patient continues to demonstrate improvement in self-awareness and her ability to catch herself in unhelpful thinking patterns.  Patient continues to process current boundaries with those within her support system.  Reflected on her desire for control and how those behaviors present in her current relationships.  Patient identifies she feels a responsibility to "parent "those in her life because she feels a lack of control over her living situations and her emotions.  Explored these feelings further.  Patient identified the root of her desire for control began around the age of 41 when her parents were divorcing, her grandmother's health was declining, she felt disregarded, and experienced frequent bullying at school.  Patient identified as a child she was given messages that taught her to hide her feelings and began to build the thought that "I am not important." Clinician and patient continue to explore expectations for healthy relationships.  Evaluated current relationships and patient's feelings that relationships are transactional.  Explored ways in  which patient can begin to feel comfortable expressing her feelings and a plan to challenge herself to "let others in."  Suicidal/Homicidal: Nowithout intent/plan  Therapist Response: Cln utilized active and supportive reflection to create a safe environment for patient to process recent life stressors.  Clinician assessed for current symptoms, stressors, safety since last session.  Clinician continued to challenge patient to evaluate and address current boundaries.  Worked with patient to explore the root of her desire for control.  Addressed symptoms of anxiety at work.  Explored messages patient interpreted and received as a child about hiding her feelings and regarding her worth/value.  Worked with patient to challenge these thoughts as well as address controllable factors.  Plan: Return again in 1 week.  Diagnosis: Recurrent major depressive disorder, in partial remission (HCC)  Generalized anxiety disorder   Collaboration of Care: AEB psychiatrist can access notes and cln. Will review psychiatrists' notes. Check in with the patient and will see LCSW per availability. Patient agreed with treatment recommendations.   Patient/Guardian was advised Release of Information must be obtained prior to any record release in order to collaborate their care with an outside provider. Patient/Guardian was advised if they have not already done so to contact the registration department to sign all necessary forms in order for [place] to release information regarding their care.   Consent: Patient/Guardian gives verbal consent for treatment and assignment of benefits for services provided during this visit. Patient/Guardian expressed understanding and agreed to proceed.   Dereck Leep, LCSW 04/03/2023

## 2023-04-10 ENCOUNTER — Ambulatory Visit: Payer: BC Managed Care – PPO | Admitting: Licensed Clinical Social Worker

## 2023-04-10 DIAGNOSIS — F3341 Major depressive disorder, recurrent, in partial remission: Secondary | ICD-10-CM

## 2023-04-10 DIAGNOSIS — F411 Generalized anxiety disorder: Secondary | ICD-10-CM | POA: Diagnosis not present

## 2023-04-10 NOTE — Progress Notes (Signed)
   THERAPIST PROGRESS NOTE  Session Time: 9:00am-9:54am  Participation Level: Active  Behavioral Response: CasualAlertAnxious  Type of Therapy: Individual Therapy  Treatment Goals addressed: LTG: Michelle Richardson will recognize socially inappropriate behaviors and develop alternative behaviors  Learn three ways to communicate verbally when angry per pt report 3 out of 5 documented sessions.   ProgressTowards Goals: Progressing  Interventions: Motivational Interviewing, Psychosocial Skills: Futures trader, and Social Skills Training  Summary:  Michelle Richardson is a 22 y.o. female who presents with symptoms of anxiety and depression.  Patient identifies symptoms to include negative self affect, low mood, anxious feelings, physical tics, isolation. Pt was oriented times 5. Pt was cooperative and engaged. Pt denies SI/HI/AVH.     Patient utilized therapeutic space to process current relationship dynamics.  Patient expressed pride in her ability to acknowledge improve communication within social relationships, specifically with friends.  Patient shared recent encounter with peer where she felt like she was able to "show up as my authentic self."Patient processed this experience and acknowledged the differences between the social interaction and others where she felt like she was able to take off her "masks."  Addressed feelings related to others sharing similar communication styles and refraining from utilizing passive or aggressive communication.  Discussed and reviewed the different forms of communication and addressed ways patient can utilize assertive communication.  Processed current dynamics with her mother and patient to utilize space to share feelings related to her recent encounter with her mother.  Through processing, patient was able to acknowledge feelings of hurt and resentment towards her mother following this interaction.  Addressed the role of patient as a child and ways in which she can  help support her mother and finding more effective ways to communicate with her.  Utilized motivational interviewing to assess patient's readiness to have this conversation with her mother.  Patient identifies she is 8 out of 10 motivated and plans to address her concerns with her mother this upcoming weekend.  Processed barriers to engaging in this conversation.    Suicidal/Homicidal: Nowithout intent/plan  Therapist Response: Cln utilized active and supportive reflection to create a safe environment for patient to process recent life stressors.  Clinician assessed for current symptoms, stressors, safety since last session.  Clinician continued to challenge patient to evaluate and address current boundaries.  Reviewed assertive, aggressive, passive communication styles and ways in which patient is utilizing each communication style and different relationships in her life.  Relies motivational interviewing to assess readiness for change and readiness to establish healthy boundaries with her mother.  Plan: Return again in 1 week.  Diagnosis: Generalized anxiety disorder  Recurrent major depressive disorder, in partial remission (HCC)    Collaboration of Care: AEB psychiatrist can access notes and cln. Will review psychiatrists' notes. Check in with the patient and will see LCSW per availability. Patient agreed with treatment recommendations.  Patient/Guardian was advised Release of Information must be obtained prior to any record release in order to collaborate their care with an outside provider. Patient/Guardian was advised if they have not already done so to contact the registration department to sign all necessary forms in order for [place] to release information regarding their care.   Consent: Patient/Guardian gives verbal consent for treatment and assignment of benefits for services provided during this visit. Patient/Guardian expressed understanding and agreed to proceed.   Dereck Leep,  LCSW 04/10/2023

## 2023-04-18 ENCOUNTER — Ambulatory Visit: Payer: BC Managed Care – PPO | Admitting: Licensed Clinical Social Worker

## 2023-04-18 DIAGNOSIS — F411 Generalized anxiety disorder: Secondary | ICD-10-CM | POA: Diagnosis not present

## 2023-04-18 DIAGNOSIS — F3341 Major depressive disorder, recurrent, in partial remission: Secondary | ICD-10-CM | POA: Diagnosis not present

## 2023-04-18 NOTE — Progress Notes (Signed)
   THERAPIST PROGRESS NOTE  Session Time: 10:26am-11am  Participation Level: Active  Behavioral Response: DisheveledDrowsyEuthymic  Type of Therapy: Individual Therapy  Treatment Goals addressed: LTG: [name] will recognize socially inappropriate behaviors and develop alternative behaviors  Learn three ways to communicate verbally when angry per pt report 3 out of 5 documented sessions.   ProgressTowards Goals: Progressing  Interventions: CBT, Solution Focused, and Supportive  Summary: Michelle Richardson is a 22 y.o. female who presents with symptoms of anxiety and depression. Patient identifies symptoms to include negative self affect, low mood, anxious feelings, physical tics, isolation. Pt was oriented times 5. Pt was cooperative and engaged. Pt denies SI/HI/AVH.   Patient utilized therapeutic space to process recent family dynamics.  Patient continued to reflect on relationship with her mother.  Reports she was able to successfully share her feelings with her mother regarding feelings of hurt and resentment towards her mother based on a previous interaction.  Patient identified she felt proud of herself for communicating her feelings transparently and resisting urges to suppress her feelings to please others.  Further discussed the relationship dynamics between patient, her older sister, and her mother.  Discussed ways in which patient can establish healthier boundaries to limit triangulation.  Role-played ways in which patient can redirect her mother when trying to intervene and dynamics between her and her sister.  Suicidal/Homicidal: Nowithout intent/plan  Therapist Response: Cln utilized active and supportive reflection to create a safe environment for patient to process recent life stressors. Clinician assessed for current symptoms, stressors, safety since last session.  Clinician continued to encourage patient to evaluate and address current boundaries.  Reviewed and role-played ways in which  patient can utilize assertive communication.  Plan: Return again in 1 week.  Diagnosis: Recurrent major depressive disorder, in partial remission (HCC)  Generalized anxiety disorder   Collaboration of Care: AEB psychiatrist can access notes and cln. Will review psychiatrists' notes. Check in with the patient and will see LCSW per availability. Patient agreed with treatment recommendations.  Patient/Guardian was advised Release of Information must be obtained prior to any record release in order to collaborate their care with an outside provider. Patient/Guardian was advised if they have not already done so to contact the registration department to sign all necessary forms in order for [place] to release information regarding their care.   Consent: Patient/Guardian gives verbal consent for treatment and assignment of benefits for services provided during this visit. Patient/Guardian expressed understanding and agreed to proceed.   Dereck Leep, LCSW 04/18/2023

## 2023-04-25 ENCOUNTER — Ambulatory Visit: Payer: BC Managed Care – PPO | Admitting: Licensed Clinical Social Worker

## 2023-04-25 DIAGNOSIS — F411 Generalized anxiety disorder: Secondary | ICD-10-CM

## 2023-04-25 DIAGNOSIS — F3341 Major depressive disorder, recurrent, in partial remission: Secondary | ICD-10-CM

## 2023-04-25 NOTE — Progress Notes (Signed)
   THERAPIST PROGRESS NOTE  Session Time: 10:02am-10:50am  Participation Level: Active  Behavioral Response: CasualAlertAnxious  Type of Therapy: Individual Therapy  Treatment Goals addressed: LTG: Dianelys will recognize socially inappropriate behaviors and develop alternative behaviors  Learn three ways to communicate verbally when angry per pt report 3 out of 5 documented sessions  ProgressTowards Goals: Progressing  Interventions: Solution Focused, Assertiveness Training, Supportive, and Social Skills Training  Summary: Michelle Richardson is a 22 y.o. female who presents with symptoms of anxiety and depression. Patient identifies symptoms to include negative self affect, low mood, anxious feelings, physical tics, isolation. Pt was oriented times 5. Pt was cooperative and engaged. Pt denies SI/HI/AVH.   Patient utilized therapeutic space to continue to process current dynamics within her family.  Patient continues to reflect on her relationship with her mother and recent conversations establishing boundaries.  Patient reports progress with her mother respecting boundaries.  Patient reports she feels she is making progress in presenting as her authentic self and relationships and feeling more comfortable removing her masks.  Patient reflected on current dynamics with her best friend and ways in which she is attempting to support her best friend.  Discussed ways in which patient is establishing healthier boundaries and the difficult process of removing unhealthy relationships.  Role-played conversation with her best friend and utilizing assertive communication.  Processed feelings of misplaced blame.  Addressed patient's patterns of worrying about the feelings of other people over the wellbeing of herself.  Patient identified ways in which she is engaging in behaviors that she has often felt uncomfortable being on the receiving end of.  Worked with patient on evaluate relationships.     Suicidal/Homicidal: Nowithout intent/plan  Therapist Response: Cln utilized active and supportive reflection to create a safe environment for patient to process recent life stressors. Clinician assessed for current symptoms, stressors, safety since last session.  Clinician worked with patient to evaluate and address current boundaries. Role-played ways in which patient can utilize assertive communication.   Plan: Return again in 1 week.  Diagnosis: Generalized anxiety disorder  Recurrent major depressive disorder, in partial remission (HCC)   Collaboration of Care: AEB psychiatrist can access notes and cln. Will review psychiatrists' notes. Check in with the patient and will see LCSW per availability. Patient agreed with treatment recommendations.   Patient/Guardian was advised Release of Information must be obtained prior to any record release in order to collaborate their care with an outside provider. Patient/Guardian was advised if they have not already done so to contact the registration department to sign all necessary forms in order for us  to release information regarding their care.   Consent: Patient/Guardian gives verbal consent for treatment and assignment of benefits for services provided during this visit. Patient/Guardian expressed understanding and agreed to proceed.   [name], LCSW 04/25/2023

## 2023-04-27 ENCOUNTER — Ambulatory Visit: Payer: BC Managed Care – PPO | Admitting: Psychology

## 2023-05-03 ENCOUNTER — Ambulatory Visit: Payer: BC Managed Care – PPO | Admitting: Licensed Clinical Social Worker

## 2023-05-03 DIAGNOSIS — F411 Generalized anxiety disorder: Secondary | ICD-10-CM

## 2023-05-03 DIAGNOSIS — F3341 Major depressive disorder, recurrent, in partial remission: Secondary | ICD-10-CM

## 2023-05-03 NOTE — Progress Notes (Signed)
   THERAPIST PROGRESS NOTE  Session Time: 9:14am-10am  Participation Level: Active  Behavioral Response: CasualAlertEuthymic  Type of Therapy: Individual Therapy  Treatment Goals addressed: LTG: Fidela will recognize socially inappropriate behaviors and develop alternative behaviors  Learn three ways to communicate verbally when angry per pt report 3 out of 5 documented sessions  ProgressTowards Goals: Progressing  Interventions: CBT, Supportive, and Reframing  Summary:  Michelle Richardson is a 23 y.o. female who presents with symptoms of anxiety and depression. Patient identifies symptoms to include negative self affect, low mood, anxious feelings, physical tics, isolation. Pt was oriented times 5. Pt was cooperative and engaged. Pt denies SI/HI/AVH.    Patient utilized therapeutic space to process current relationship citing improvements as she feels she does not need to wear a mask around her partner as she feels more comfortable being herself.  Processed anxiety about getting close to others and feels vulnerability equals weakness.  Worked with clinician to identify memories that reinforced this thought.  Patient processed feelings and thoughts related to being a child of divorced parents and discussed behaviors observed in parents marriage.  Worked with patient to identify healthy representations of love and discussed the evolution of individuals within a relationship.  Patient identified ways in which she needs to improve communication with her partner.  Processed patient's fear of behaving like her parents.  Discussed ways in which patient is limiting herself due to the fear of becoming like her parents.  Patient was given the assignment to practice acknowledging her opportunities to allow herself to experience love.   Suicidal/Homicidal: Nowithout intent/plan  Therapist Response: Cln utilized active and supportive reflection to create a safe environment for patient to process recent life  stressors. Clinician assessed for current symptoms, stressors, safety since last session.  Clinician worked with patient to evaluate and address current boundaries. Processed unhealthy behaviors observed and reinforced by witnessing her parent divorce. Challenged thoughts about patient feeling undeserving of love and associated love with weakness.   Plan: Return again in 1 week.  Diagnosis: Generalized anxiety disorder  Recurrent major depressive disorder, in partial remission (HCC)   Collaboration of Care: AEB psychiatrist can access notes and cln. Will review psychiatrists' notes. Check in with the patient and will see LCSW per availability. Patient agreed with treatment recommendations.   Patient/Guardian was advised Release of Information must be obtained prior to any record release in order to collaborate their care with an outside provider. Patient/Guardian was advised if they have not already done so to contact the registration department to sign all necessary forms in order for us  to release information regarding their care.   Consent: Patient/Guardian gives verbal consent for treatment and assignment of benefits for services provided during this visit. Patient/Guardian expressed understanding and agreed to proceed.   [name], LCSW 05/03/2023

## 2023-05-08 ENCOUNTER — Ambulatory Visit (INDEPENDENT_AMBULATORY_CARE_PROVIDER_SITE_OTHER): Payer: BC Managed Care – PPO | Admitting: Licensed Clinical Social Worker

## 2023-05-08 DIAGNOSIS — Z91199 Patient's noncompliance with other medical treatment and regimen due to unspecified reason: Secondary | ICD-10-CM

## 2023-05-08 NOTE — Progress Notes (Signed)
 Clinician attempted session via face-to-face, but Michelle Richardson did not appear for her session. Cln. called pt. Pt reports she slept through her appointment. Pt was informed of [facility]'s attendance policy and made aware she will have to pay a no show fee.

## 2023-05-15 ENCOUNTER — Ambulatory Visit: Payer: BC Managed Care – PPO | Admitting: Licensed Clinical Social Worker

## 2023-05-15 DIAGNOSIS — F3341 Major depressive disorder, recurrent, in partial remission: Secondary | ICD-10-CM | POA: Diagnosis not present

## 2023-05-15 DIAGNOSIS — F411 Generalized anxiety disorder: Secondary | ICD-10-CM | POA: Diagnosis not present

## 2023-05-15 NOTE — Progress Notes (Signed)
   THERAPIST PROGRESS NOTE  Session Time: 9:03am-9:45am  Participation Level: Active  Behavioral Response: CasualAlertEuthymic  Type of Therapy: Individual Therapy  Treatment Goals addressed: LTG: [name] will recognize socially inappropriate behaviors and develop alternative behaviors  Learn three ways to communicate verbally when angry per pt report 3 out of 5 documented sessions  Reduce overall frequency, intensity and duration of anxiety so that daily functioning is not impaired per pt self report 3 out of 5 sessions.   ProgressTowards Goals: Progressing  Interventions: CBT, Supportive, and Reframing  Summary: Michelle Richardson is a 23 y.o. female who presents with symptoms of anxiety and depression. Patient identifies symptoms to include negative self affect,physical tics, isolation. Pt was oriented times 5. Pt was cooperative and engaged. Pt denies SI/HI/AVH.    Patient utilized therapeutic space to process communication within her current relationship.  Patient addressed ways in which she has successfully utilized assertive communication and established healthy boundaries.  Patient continues to demonstrate improvements in her ability to work towards vulnerability and trusting other individuals.  Discussed ways in which current [name] is challenging roles within her relationship and providing support for the patient.  Patient discussed feelings towards being the one receiving care for and provided a safe space to process her feelings.  Explored patient's feelings towards improvements in her patterns of avoidance.  Processed growth in therapy.  Patient expressed difficulties maintaining medication compliance.  Clinician explored ways in which patient can work towards healthy medication regimen.  Explored patient's barriers as she feels medication can be stigmatized.  Clinician encouraged patient to continue to check in with psychiatrist regarding medication compliance.  Suicidal/Homicidal:  Nowithout intent/plan  Therapist Response: Cln utilized active and supportive reflection to create a safe environment for patient to process recent life stressors. Clinician assessed for current symptoms, stressors, safety since last session.  Clinician worked with patient to evaluate and address current boundaries. Explored patients progress in therapy.   Plan: Return again in 2 weeks.  Diagnosis: Generalized anxiety disorder  Recurrent major depressive disorder, in partial remission (HCC)   Collaboration of Care: AEB psychiatrist can access notes and cln. Will review psychiatrists' notes. Check in with the patient and will see LCSW per availability. Patient agreed with treatment recommendations.   Patient/Guardian was advised Release of Information must be obtained prior to any record release in order to collaborate their care with an outside provider. Patient/Guardian was advised if they have not already done so to contact the registration department to sign all necessary forms in order for [place] to release information regarding their care.   Consent: Patient/Guardian gives verbal consent for treatment and assignment of benefits for services provided during this visit. Patient/Guardian expressed understanding and agreed to proceed.   Dereck Leep, LCSW 05/15/2023

## 2023-05-19 ENCOUNTER — Other Ambulatory Visit: Payer: Self-pay

## 2023-05-19 ENCOUNTER — Ambulatory Visit: Payer: BC Managed Care – PPO | Admitting: Internal Medicine

## 2023-05-19 ENCOUNTER — Encounter: Payer: Self-pay | Admitting: Internal Medicine

## 2023-05-19 VITALS — BP 124/82 | HR 100 | Temp 98.8°F | Resp 18 | Ht 69.5 in | Wt 298.3 lb

## 2023-05-19 DIAGNOSIS — R051 Acute cough: Secondary | ICD-10-CM | POA: Diagnosis not present

## 2023-05-19 DIAGNOSIS — J069 Acute upper respiratory infection, unspecified: Secondary | ICD-10-CM | POA: Diagnosis not present

## 2023-05-19 DIAGNOSIS — J029 Acute pharyngitis, unspecified: Secondary | ICD-10-CM

## 2023-05-19 LAB — POCT RAPID STREP A (OFFICE): Rapid Strep A Screen: NEGATIVE

## 2023-05-19 MED ORDER — METHYLPREDNISOLONE 4 MG PO TBPK: ORAL_TABLET | ORAL | 0 refills | Status: DC

## 2023-05-19 MED ORDER — BENZONATATE 100 MG PO CAPS: 100.0000 mg | ORAL_CAPSULE | Freq: Two times a day (BID) | ORAL | 0 refills | Status: DC | PRN

## 2023-05-19 NOTE — Progress Notes (Signed)
Acute Office Visit  Subjective:     Patient ID: Michelle Richardson, female    DOB: Dec 31, 2000, 23 y.o.   MRN: [id number]  Chief Complaint  Patient presents with   URI    Cough, congestion for 5 days   Otalgia    Behind right ear    URI  Associated symptoms include congestion, coughing, diarrhea, headaches, nausea, sinus pain, a sore throat and vomiting. Pertinent negatives include no chest pain, ear pain or wheezing.  Otalgia  Associated symptoms include coughing, diarrhea, headaches, a sore throat and vomiting. Pertinent negatives include no ear discharge or hearing loss.   Patient is in today for cough, congestion. Symptoms started with feeling overheated, headaches and pain behind right ear about 5 days ago  URI Compliant:  -Fever: yes 101.3, last night  -Cough: yes, productive  -Shortness of breath: yes after coughing  -Wheezing: no -Nasal congestion: yes -Runny nose: no -Post nasal drip: yes -Sore throat: yes -Sinus pressure: yes, behind left eye -Headache: yes -Face pain: yes -Ear pain: no  -Ear pressure: no   -Vomiting: yes -Nausea and vomiting, diarrhea  -Sick contacts: no -Context: fluctuating -Relief with OTC cold/cough medications: no  -Treatments attempted: mucinex and anti-histamine    Review of Systems  Constitutional:  Positive for chills, fever and malaise/fatigue.  HENT:  Positive for congestion, sinus pain and sore throat. Negative for ear discharge, ear pain, hearing loss and tinnitus.   Respiratory:  Positive for cough, sputum production and shortness of breath. Negative for wheezing.   Cardiovascular:  Negative for chest pain.  Gastrointestinal:  Positive for diarrhea, nausea and vomiting.  Neurological:  Positive for headaches.        Objective:    BP 124/82 (Cuff Size: Large)   Pulse 100   Temp 98.8 F (37.1 C) (Oral)   Resp 18   Ht 5' 9.5" (1.765 m)   Wt 298 lb 4.8 oz (135.3 kg)   SpO2 97%   BMI 43.42 kg/m  BP Readings from Last 3  Encounters:  05/19/23 124/82  02/15/23 116/78  08/05/22 134/74   Wt Readings from Last 3 Encounters:  05/19/23 298 lb 4.8 oz (135.3 kg)  02/15/23 (!) 308 lb (139.7 kg)  08/05/22 (!) 308 lb (139.7 kg) (>99%, Z= 2.87)*   * Growth percentiles are based on CDC (Girls, 2-20 Years) data.      Physical Exam Constitutional:      Appearance: Normal appearance.  HENT:     Head: Normocephalic and atraumatic.     Right Ear: Tympanic membrane, ear canal and external ear normal.     Left Ear: Tympanic membrane, ear canal and external ear normal.     Ears:     Comments: Mild tenderness behind right ear but higher on scalp, no pain over mastoid    Nose: Congestion present.     Mouth/Throat:     Mouth: Mucous membranes are moist.     Pharynx: Posterior oropharyngeal erythema present.  Eyes:     Conjunctiva/sclera: Conjunctivae normal.  Cardiovascular:     Rate and Rhythm: Normal rate and regular rhythm.  Pulmonary:     Effort: Pulmonary effort is normal.     Breath sounds: Normal breath sounds. No wheezing, rhonchi or rales.  Skin:    General: Skin is warm and dry.  Neurological:     General: No focal deficit present.     Mental Status: She is alert. Mental status is at baseline.  Psychiatric:  Mood and Affect: Mood normal.        Behavior: Behavior normal.     Results for orders placed or performed in visit on 05/19/23  POCT rapid strep A  Result Value Ref Range   Rapid Strep A Screen Negative Negative        Assessment & Plan:   1. Viral upper respiratory tract infection (Primary)/Acute cough/Sore throat: Symptoms consistent with viral illness, strep negative, COVID pending. Will treat symptomatically with Medrol dose pack, cough suppressant, continue over the counter medications. Follow up if symptoms worsen or fail to improve.   - methylPREDNISolone (MEDROL DOSEPAK) 4 MG TBPK tablet; Day 1: Take 8 mg (2 tablets) before breakfast, 4 mg (1 tablet) after lunch, 4 mg (1  tablet) after supper, and 8 mg (2 tablets) at bedtime. Day 2:Take 4 mg (1 tablet) before breakfast, 4 mg (1 tablet) after lunch, 4 mg (1 tablet) after supper, and 8 mg (2 tablets) at bedtime. Day 3: Take 4 mg (1 tablet) before breakfast, 4 mg (1 tablet) after lunch, 4 mg (1 tablet) after supper, and 4 mg (1 tablet) at bedtime. Day 4: Take 4 mg (1 tablet) before breakfast, 4 mg (1 tablet) after lunch, and 4 mg (1 tablet) at bedtime. Day 5: Take 4 mg (1 tablet) before breakfast and 4 mg (1 tablet) at bedtime. Day 6: Take 4 mg (1 tablet) before breakfast.  Dispense: 1 each; Refill: 0 - benzonatate (TESSALON) 100 MG capsule; Take 1 capsule (100 mg total) by mouth 2 (two) times daily as needed for cough.  Dispense: 20 capsule; Refill: 0 - POCT rapid strep A - Novel Coronavirus, NAA ([facility])   Return if symptoms worsen or fail to improve.  Margarita Mail, DO

## 2023-05-23 ENCOUNTER — Ambulatory Visit: Payer: BC Managed Care – PPO | Admitting: Family Medicine

## 2023-05-24 ENCOUNTER — Ambulatory Visit: Payer: BC Managed Care – PPO | Admitting: Family Medicine

## 2023-05-24 ENCOUNTER — Encounter: Payer: Self-pay | Admitting: Family Medicine

## 2023-05-24 ENCOUNTER — Other Ambulatory Visit (HOSPITAL_COMMUNITY)
Admission: RE | Admit: 2023-05-24 | Discharge: 2023-05-24 | Disposition: A | Discharge status: Home / Self Care | Payer: BC Managed Care – PPO | Source: Ambulatory Visit | Attending: Family Medicine | Admitting: Family Medicine

## 2023-05-24 ENCOUNTER — Encounter: Payer: Self-pay | Admitting: Internal Medicine

## 2023-05-24 VITALS — BP 124/80 | HR 101 | Temp 98.1°F | Resp 16 | Ht 69.5 in | Wt 293.4 lb

## 2023-05-24 DIAGNOSIS — E559 Vitamin D deficiency, unspecified: Secondary | ICD-10-CM | POA: Diagnosis not present

## 2023-05-24 DIAGNOSIS — D500 Iron deficiency anemia secondary to blood loss (chronic): Secondary | ICD-10-CM

## 2023-05-24 DIAGNOSIS — Z113 Encounter for screening for infections with a predominantly sexual mode of transmission: Secondary | ICD-10-CM | POA: Diagnosis not present

## 2023-05-24 DIAGNOSIS — Z30011 Encounter for initial prescription of contraceptive pills: Secondary | ICD-10-CM | POA: Diagnosis not present

## 2023-05-24 DIAGNOSIS — J45909 Unspecified asthma, uncomplicated: Secondary | ICD-10-CM | POA: Diagnosis not present

## 2023-05-24 DIAGNOSIS — Z30013 Encounter for initial prescription of injectable contraceptive: Secondary | ICD-10-CM

## 2023-05-24 LAB — NOVEL CORONAVIRUS, NAA: SARS-CoV-2, NAA: NOT DETECTED

## 2023-05-24 LAB — SPECIMEN STATUS REPORT

## 2023-05-24 MED ORDER — MEDROXYPROGESTERONE ACETATE 150 MG/ML IM SUSP: 150.0000 mg | INTRAMUSCULAR | 1 refills | Status: DC

## 2023-05-24 MED ORDER — MEDROXYPROGESTERONE ACETATE 150 MG/ML IM SUSY
150.0000 mg | PREFILLED_SYRINGE | Freq: Once | INTRAMUSCULAR | Status: AC
  Administered 2023-05-24: 150 mg via INTRAMUSCULAR

## 2023-05-24 NOTE — Addendum Note (Signed)
Addended by: Forde Radon on: 05/24/2023 01:17 PM   Modules accepted: Orders

## 2023-05-24 NOTE — Progress Notes (Signed)
Name: Michelle Richardson   MRN: [id number]    DOB: November 25, 2000   Date:05/24/2023       Progress Note  Subjective  Chief Complaint  Chief Complaint  Patient presents with   Contraception    Would like to start [facility]    Discussed the use of AI scribe software for clinical note transcription with the patient, who gave verbal consent to proceed.  History of Present Illness   The patient is a 23 year old female who presents for discussion of contraceptive options.  She has recently become sexually active with a new partner and is currently using condoms for protection. She is interested in exploring additional contraceptive methods to prevent pregnancy.  Her last menstrual period began on January 15th. She experiences heavy menstrual bleeding, which she is accustomed to, and it has not been a significant concern for her.  She is currently taking several medications: Buspar, duloxetine (20 mg and 60 mg), Wixela daily for asthma, Minipress at bedtime for sleep, propranolol for anxiety, and vitamin D. She takes her iron and vitamin D supplements only occasionally.  Her last blood work in October showed low iron levels, normal B12, normal sugar levels, normal cholesterol, normal prolactin, and low vitamin D. Her A1c was elevated. She has not been consistent with taking her iron and vitamin D supplements.  She met her current partner at his workplace, where she frequently visits after her late work hours. She has known her partner for about a year and started dating one to two months ago.        Patient Active Problem List   Diagnosis Date Noted   Recurrent major depressive disorder, in partial remission (HCC) 03/29/2023   Prediabetes 02/15/2023   Iron deficiency anemia due to chronic blood loss 02/15/2023   Insomnia 12/21/2022   Recurrent major depressive disorder, in full remission (HCC) 12/21/2022   Attention and concentration deficit 03/31/2022   Morbid obesity (HCC) 01/05/2022   Vitamin D  deficiency 01/05/2022   Asthma, mild intermittent, poorly controlled 01/05/2022   Generalized anxiety disorder 04/16/2019   Moderate episode of recurrent major depressive disorder (HCC) 04/16/2019    Social History   Tobacco Use   Smoking status: Never   Smokeless tobacco: Never  Substance Use Topics   Alcohol use: Never     Current Outpatient Medications:    busPIRone (BUSPAR) 5 MG tablet, TAKE 1 TABLET BY MOUTH TWICE A DAY, Disp: 180 tablet, Rfl: 0   DULoxetine (CYMBALTA) 20 MG capsule, TAKE 1 CAPSULE (20 MG TOTAL) BY MOUTH DAILY. TAKE ALONG WITH 60 MG DAILY - TOTAL OF 80 MG DAILY, Disp: 90 capsule, Rfl: 0   DULoxetine (CYMBALTA) 60 MG capsule, TAKE 1 CAPSULE (60 MG TOTAL) BY MOUTH DAILY. TAKE ALONG WITH 20 MG DAILY, Disp: 90 capsule, Rfl: 0   fluticasone-salmeterol (WIXELA INHUB) 250-50 MCG/ACT AEPB, INHALE 1 PUFF INTO THE LUNGS IN THE MORNING AND AT BEDTIME., Disp: 180 each, Rfl: 3   prazosin (MINIPRESS) 1 MG capsule, TAKE 1 CAPSULE BY MOUTH EVERYDAY AT BEDTIME, Disp: 90 capsule, Rfl: 0   propranolol (INDERAL) 10 MG tablet, TAKE 1 TABLET BY MOUTH 2 TIMES DAILY AS NEEDED. FOR ANXIETY, Disp: 180 tablet, Rfl: 0   Vitamin D, Ergocalciferol, (DRISDOL) 1.25 MG (50000 UNIT) CAPS capsule, Take 1 capsule (50,000 Units total) by mouth every 7 (seven) days. Please take every Thursday starting 01/17/2019, Disp: 12 capsule, Rfl: 1  No Known Allergies  ROS  Ten systems reviewed and is negative except  as mentioned in HPI    Objective  Vitals:   05/24/23 1124  BP: 124/80  Pulse: (!) 101  Resp: 16  Temp: 98.1 F (36.7 C)  TempSrc: Oral  SpO2: 98%  Weight: 293 lb 6.4 oz (133.1 kg)  Height: 5' 9.5" (1.765 m)    Body mass index is 42.71 kg/m.  Physical Exam  Constitutional: Patient appears well-developed and well-nourished. Obese  No distress.  HEENT: head atraumatic, normocephalic, pupils equal and reactive to light, neck supple Cardiovascular: Normal rate, regular rhythm and  normal heart sounds.  No murmur heard. No BLE edema. Pulmonary/Chest: Effort normal and breath sounds normal. No respiratory distress. Abdominal: Soft.  There is no tenderness. Psychiatric: Patient has a normal mood and affect. behavior is normal. Judgment and thought content normal.   Recent Results (from the past 2160 hours)  POCT rapid strep A     Status: None   Collection Time: 05/19/23  4:00 PM  Result Value Ref Range   Rapid Strep A Screen Negative Negative  Novel Coronavirus, NAA ([facility])     Status: None   Collection Time: 05/22/23 12:00 AM   Specimen: Nasopharyngeal(NP) swabs in vial transport medium   Nasopharynge  Previous  Result Value Ref Range   SARS-CoV-2, NAA Not Detected Not Detected    Comment: This nucleic acid amplification test was developed and its performance characteristics determined by [employer]. Nucleic acid amplification tests include RT-PCR and TMA. This test has not been FDA cleared or approved. This test has been authorized by FDA under an Emergency Use Authorization (EUA). This test is only authorized for the duration of time the declaration that circumstances exist justifying the authorization of the emergency use of in vitro diagnostic tests for detection of SARS-CoV-2 virus and/or diagnosis of COVID-19 infection under section 564(b)(1) of the Act, 21 U.S.C. 960AVW-0(J) (1), unless the authorization is terminated or revoked sooner. When diagnostic testing is negative, the possibility of a false negative result should be considered in the context of a patient's recent exposures and the presence of clinical signs and symptoms consistent with COVID-19. An individual without symptoms of COVID-19 and who is not shedding SARS-CoV-2 virus wo uld expect to have a negative (not detected) result in this assay.   Specimen status report     Status: None   Collection Time: 05/22/23 12:00 AM  Result Value Ref Range   specimen status report Comment      Comment: Please note Please note The date and/or time of collection was not indicated on the requisition as required by state and federal law.  The date of receipt of the specimen was used as the collection date if not supplied.      Assessment and Plan    Contraception New sexual partner and desire to avoid pregnancy. Discussed various contraceptive options including oral contraceptives, patch, NuvaRing, IUD, Depo-Provera shot, and Nexplanon. Patient expressed interest in Depo-Provera shot. -Start Depo-Provera shot today, to be repeated every 3 months. -Plan to reassess contraceptive method after 2 shots (6 months).  Sexually Transmitted Infections (STIs) New sexual partner. Discussed the importance of STI screening with each new partner. -Perform STI screening today, including HIV, syphilis, gonorrhea, and chlamydia.  Iron Deficiency Anemia History of heavy menstrual bleeding and low iron levels. Patient reports inconsistent use of iron supplements. -Encourage consistent use of iron supplements. -she does not want to check levels today   Asthma Daily use of Wixela upon waking. -Continue current regimen.  Anxiety Managed with Buspar  and Propranolol. -Continue current regimen. Sees Dr. Elna Breslow   Depression Managed with Duloxetine. -Continue current regimen. Under the care of Dr. Elna Breslow and seems to be doing well   Vitamin D Deficiency History of low Vitamin D levels. Patient reports inconsistent use of Vitamin D supplements. -Encourage consistent use of Vitamin D supplements.  Follow-up in 3 months to assess response to Depo-Provera shot and discuss any side effects or problems.

## 2023-05-25 LAB — RPR: RPR Ser Ql: NONREACTIVE

## 2023-05-25 LAB — HIV ANTIBODY (ROUTINE TESTING W REFLEX): HIV 1&2 Ab, 4th Generation: NONREACTIVE

## 2023-05-26 LAB — CERVICOVAGINAL ANCILLARY ONLY
Chlamydia: NEGATIVE
Comment: NEGATIVE
Comment: NEGATIVE
Comment: NORMAL
Neisseria Gonorrhea: NEGATIVE
Trichomonas: NEGATIVE

## 2023-05-29 ENCOUNTER — Encounter: Payer: Self-pay | Admitting: Family Medicine

## 2023-06-01 ENCOUNTER — Ambulatory Visit: Payer: BC Managed Care – PPO | Admitting: Licensed Clinical Social Worker

## 2023-06-01 DIAGNOSIS — F3341 Major depressive disorder, recurrent, in partial remission: Secondary | ICD-10-CM

## 2023-06-01 DIAGNOSIS — F411 Generalized anxiety disorder: Secondary | ICD-10-CM

## 2023-06-01 NOTE — Progress Notes (Signed)
   THERAPIST PROGRESS NOTE  Session Time: 10:02am-10:54am  Participation Level: Active  Behavioral Response: CasualAlertAnxious  Type of Therapy: Individual Therapy  Treatment Goals addressed:  Pt stated that she wants to cope with her anxiety               Goal: Anxiety           Description: Reduce overall frequency, intensity and duration of anxiety so that daily functioning is not impaired per pt self report 3 out of 5 sessions.       Goal: LTG: Reduce frequency, intensity, and duration of depression symptoms so that daily functioning is improved                            Goal: Depression           Description: Develop healthy thinking patterns about self, others and beliefs about self to help alleviate symptoms of depression per pt report 3 out 5 sessions.       ProgressTowards Goals: Progressing  Interventions: Strength-based, Conservator, Museum/gallery, and Supportive  Summary: Michelle Richardson is a 23 y.o. female who presents with symptoms of anxiety and depression including but not limited to uncontrollable worry, anxious feelings, pressurized speech, restlessness, low mood. Pt was oriented times 5. Pt was cooperative and engaged. Pt denies SI/HI/AVH.     Patient utilized therapeutic space to process feelings related to supporting a friend through homelessness and displacement.  Patient identified ways in which she was able to establish healthy lines of communication as well as set healthy boundaries within numerous personal relationships.  Patient reflected on relational distress involving distrust in previous events she feels are being projected within the relationship.   Clinician worked with patient on evaluating self-care and ensuring patient is aware of limited window of tolerance as a result of recent life events.  Addressed ways in which patient can practice communicating breaks needed to address her emotional brain and tap into her logical brain.  Also discussed ways  in which patient can ensure she is engaging in time alone to process her feelings.  Included time to reflect on patient's growth with how she handled certain situations.   Suicidal/Homicidal: Nowithout intent/plan  Therapist Response: Clinician utilized active and supportive reflection to create a safe space for patient to process recent life events.  Clinician assessed for current symptoms, stressors, safety since last session.  Clinician continued to address ways in which patient can establish healthy boundaries and utilize assertive communication.  Offered reflective opportunities for patient to process improvements in growth since beginning therapy.  Plan: Return again in 2 weeks.  Diagnosis:Generalized anxiety disorder  Recurrent major depressive disorder, in partial remission (HCC)   Collaboration of Care: AEB psychiatrist can access notes and cln. Will review psychiatrists' notes. Check in with the patient and will see LCSW per availability. Patient agreed with treatment recommendations.   Patient/Guardian was advised Release of Information must be obtained prior to any record release in order to collaborate their care with an outside provider. Patient/Guardian was advised if they have not already done so to contact the registration department to sign all necessary forms in order for us  to release information regarding their care.   Consent: Patient/Guardian gives verbal consent for treatment and assignment of benefits for services provided during this visit. Patient/Guardian expressed understanding and agreed to proceed.   [name], LCSW 06/01/2023

## 2023-06-04 ENCOUNTER — Other Ambulatory Visit: Payer: Self-pay | Admitting: Psychiatry

## 2023-06-04 DIAGNOSIS — F3342 Major depressive disorder, recurrent, in full remission: Secondary | ICD-10-CM

## 2023-06-04 DIAGNOSIS — F411 Generalized anxiety disorder: Secondary | ICD-10-CM

## 2023-06-15 ENCOUNTER — Ambulatory Visit: Payer: BC Managed Care – PPO | Admitting: Licensed Clinical Social Worker

## 2023-06-15 DIAGNOSIS — F411 Generalized anxiety disorder: Secondary | ICD-10-CM

## 2023-06-15 DIAGNOSIS — F3341 Major depressive disorder, recurrent, in partial remission: Secondary | ICD-10-CM | POA: Diagnosis not present

## 2023-06-15 NOTE — Progress Notes (Signed)
THERAPIST PROGRESS NOTE  Session Time: 10:05am-10:53am  Participation Level: Active  Behavioral Response: CasualAlertEuthymic  Type of Therapy: Individual Therapy  Treatment Goals addressed:  Pt stated that she wants to cope with her anxiety                               Goal: Anxiety             Description: Reduce overall frequency, intensity and duration of anxiety so that daily functioning is not impaired per pt self report 3 out of 5 sessions.                 Goal: LTG: Reduce frequency, intensity, and duration of depression symptoms so that daily functioning is improved                                               Goal: Depression             Description: Develop healthy thinking patterns about self, others and beliefs about self to help alleviate symptoms of depression per pt report 3 out 5 sessions.       ProgressTowards Goals: Progressing  Interventions: CBT, Supportive, and Reframing  Summary:  Michelle Richardson is a 24 y.o. female who presents with symptoms of anxiety and depression including but not limited to uncontrollable worry, anxious feelings, pressurized speech, restlessness, low mood. Pt was oriented times 5. Pt was cooperative and engaged. Pt denies SI/HI/AVH.     Patient utilized therapeutic space to process dynamics around supporting a friend through homelessness.  Discussed ways in which patient is grappling with sharing her space while also establishing healthy boundaries.  Continue to process relationship dynamics between her and her mother and within her romantic relationship.  Discussed ways in which patient can utilize assertive communication to establish healthy communication dynamics.  Clinician assessed for patient's readiness to set expectations for rules and responsibility in the home.  Patient identified barriers identifying her mother would not be supportive of set boundaries.  Discussed ways in which patient could shift perspectives to  better understand where others might be coming from and how they choose to approach situations.  Problem solved ways in which patient can have conversations with certain individuals in her life to set clear expectations and boundaries.  Suicidal/Homicidal: Nowithout intent/plan  Therapist Response: Clinician utilized active and supportive reflection to create a safe space for patient to process recent life events. Clinician assessed for current symptoms, stressors, safety since last session. Clinician continued to address ways in which patient can establish healthy boundaries and utilize assertive communication.   Plan: Return again in 2 weeks.  Diagnosis: Generalized anxiety disorder  Recurrent major depressive disorder, in partial remission (HCC)   Collaboration of Care: AEB psychiatrist can access notes and cln. Will review psychiatrists' notes. Check in with the patient and will see LCSW per availability. Patient agreed with treatment recommendations.   Patient/Guardian was advised Release of Information must be obtained prior to any record release in order to collaborate their care with an outside provider. Patient/Guardian was advised if they have not already done so to contact the registration department to sign all necessary forms in order for [place] to release information regarding their care.   Consent: Patient/Guardian gives verbal consent for treatment and assignment of benefits  for services provided during this visit. Patient/Guardian expressed understanding and agreed to proceed.   Dereck Leep, LCSW 06/15/2023

## 2023-06-21 ENCOUNTER — Ambulatory Visit: Payer: BC Managed Care – PPO | Admitting: Psychiatry

## 2023-06-21 ENCOUNTER — Encounter: Payer: Self-pay | Admitting: Psychiatry

## 2023-06-21 VITALS — BP 110/70 | HR 96 | Temp 97.4°F | Ht 69.5 in | Wt 294.4 lb

## 2023-06-21 DIAGNOSIS — F411 Generalized anxiety disorder: Secondary | ICD-10-CM | POA: Diagnosis not present

## 2023-06-21 DIAGNOSIS — F3341 Major depressive disorder, recurrent, in partial remission: Secondary | ICD-10-CM | POA: Diagnosis not present

## 2023-06-21 DIAGNOSIS — F5101 Primary insomnia: Secondary | ICD-10-CM

## 2023-06-21 MED ORDER — PROPRANOLOL HCL 10 MG PO TABS: 10.0000 mg | ORAL_TABLET | Freq: Two times a day (BID) | ORAL | 0 refills | Status: DC | PRN

## 2023-06-21 MED ORDER — DULOXETINE HCL 60 MG PO CPEP: 60.0000 mg | ORAL_CAPSULE | Freq: Every day | ORAL | 0 refills | Status: DC

## 2023-06-21 MED ORDER — DULOXETINE HCL 20 MG PO CPEP: 20.0000 mg | ORAL_CAPSULE | Freq: Every day | ORAL | 0 refills | Status: DC

## 2023-06-21 NOTE — Progress Notes (Unsigned)
 BH MD OP Progress Note  06/21/2023 5:27 PM Jessamyn Watterson  MRN:  [id number]  Chief Complaint:  Chief Complaint  Patient presents with   Follow-up   Anxiety   Depression   Medication Refill   HPI: Michelle Richardson is a 23 year old African-American female, lives in [place] with her mother, currently works at [employer], graduated from [employer], has a history of MDD, GAD, insomnia was evaluated in office today.  She is undergoing therapy for anxiety and insomnia, which she finds beneficial as it provides a space to discuss issues she is uncomfortable sharing with friends. Family obligations, such as picking up her sister from school, contribute to her stress and make her feel rushed to appointments.  She experiences insomnia, exacerbated by her late-night work schedule, which affects her ability to focus. She occasionally uses medication for nightmares, which also aids in sleep.  She was previously evaluated for ADHD, but the diagnosis was not confirmed. Her symptoms were attributed to anxiety and insomnia. She takes propranolol for anxiety as needed, which causes sleepiness, leading her to consume caffeine to stay awake. She takes duloxetine daily but sometimes skips Buspar to see if she can manage without it.  Her living situation is affected by a friend staying with her due to homelessness, compromising her personal space. While supportive of her friend, she acknowledges the strain it places on her mental health. She lives with her mother and two sisters in a four-bedroom, three-bathroom house and is responsible for some family obligations.  She denies any suicidality, homicidality or perceptual disturbances.  Visit Diagnosis:    ICD-10-CM   1. Recurrent major depressive disorder, in partial remission (HCC)  F33.41 DULoxetine (CYMBALTA) 60 MG capsule    DULoxetine (CYMBALTA) 20 MG capsule    2. Generalized anxiety disorder  F41.1 propranolol (INDERAL) 10 MG tablet    DULoxetine (CYMBALTA) 20 MG  capsule    3. Primary insomnia  F51.01       Past Psychiatric History: I have reviewed past psychiatric history from progress note on 03/30/2022.  Patient completed ADHD testing per Dr. Lindon Romp 01/12/2023 patient does not meet criteria for ADHD.  Patient may benefit from testing for sleep apnea.  Past Medical History:  Past Medical History:  Diagnosis Date   Asthma    Pediatric obesity    Suicide ideation 01/11/2019    Past Surgical History:  Procedure Laterality Date   TONSILLECTOMY AND ADENOIDECTOMY  08/02/2008    Family Psychiatric History: I have reviewed family psychiatric history from progress note on 03/30/2022.  Family History:  Family History  Problem Relation Age of Onset   Hypertension Mother    Drug abuse Father    Autism spectrum disorder Sister     Social History: I have reviewed social history from progress note on 03/30/2022. Social History   Socioeconomic History   Marital status: Single    Spouse name: Not on file   Number of children: 0   Years of education: Not on file   Highest education level: 12th grade  Occupational History   Not on file  Tobacco Use   Smoking status: Never   Smokeless tobacco: Never  Vaping Use   Vaping status: Never Used  Substance and Sexual Activity   Alcohol use: Never   Drug use: Never   Sexual activity: Not Currently    Birth control/protection: Abstinence  Other Topics Concern   Not on file  Social History Narrative   Not on file   Social Drivers of  Health   Financial Resource Strain: Low Risk  (05/23/2023)   Overall Financial Resource Strain (CARDIA)    Difficulty of Paying Living Expenses: Not very hard  Food Insecurity: No Food Insecurity (05/23/2023)   Hunger Vital Sign    Worried About Running Out of Food in the Last Year: Never true    Ran Out of Food in the Last Year: Never true  Transportation Needs: No Transportation Needs (05/23/2023)   PRAPARE - Administrator, Civil Service  (Medical): No    Lack of Transportation (Non-Medical): No  Physical Activity: Sufficiently Active (05/23/2023)   Exercise Vital Sign    Days of Exercise per Week: 3 days    Minutes of Exercise per Session: 70 min  Stress: Stress Concern Present (05/23/2023)   [employer] of Occupational Health - Occupational Stress Questionnaire    Feeling of Stress : To some extent  Social Connections: Moderately Isolated (05/23/2023)   Social Connection and Isolation Panel [NHANES]    Frequency of Communication with Friends and Family: More than three times a week    Frequency of Social Gatherings with Friends and Family: Once a week    Attends Religious Services: 1 to 4 times per year    Active Member of Clubs or Organizations: No    Attends Engineer, structural: Never    Marital Status: Never married    Allergies: No Known Allergies  Metabolic Disorder Labs: Lab Results  Component Value Date   HGBA1C 6.0 (H) 02/15/2023   MPG 126 02/15/2023   MPG 120 01/06/2022   Lab Results  Component Value Date   PROLACTIN 8.3 02/15/2023   PROLACTIN 11.1 01/06/2022   Lab Results  Component Value Date   CHOL 144 02/15/2023   TRIG 74 02/15/2023   HDL 60 02/15/2023   CHOLHDL 2.4 02/15/2023   VLDL 12 01/11/2019   LDLCALC 69 02/15/2023   LDLCALC 80 01/06/2022   Lab Results  Component Value Date   TSH 0.73 01/06/2022   TSH 0.863 01/11/2019    Therapeutic Level Labs: No results found for: "LITHIUM" No results found for: "VALPROATE" No results found for: "CBMZ"  Current Medications: Current Outpatient Medications  Medication Sig Dispense Refill   busPIRone (BUSPAR) 5 MG tablet TAKE 1 TABLET BY MOUTH TWICE A DAY 180 tablet 0   fluticasone-salmeterol (WIXELA INHUB) 250-50 MCG/ACT AEPB INHALE 1 PUFF INTO THE LUNGS IN THE MORNING AND AT BEDTIME. 180 each 3   medroxyPROGESTERone (DEPO-PROVERA) 150 MG/ML injection Inject 1 mL (150 mg total) into the muscle every 3 (three) months. 1 mL 1    prazosin (MINIPRESS) 1 MG capsule TAKE 1 CAPSULE BY MOUTH EVERYDAY AT BEDTIME 90 capsule 0   Vitamin D, Ergocalciferol, (DRISDOL) 1.25 MG (50000 UNIT) CAPS capsule Take 1 capsule (50,000 Units total) by mouth every 7 (seven) days. Please take every Thursday starting 01/17/2019 12 capsule 1   DULoxetine (CYMBALTA) 20 MG capsule Take 1 capsule (20 mg total) by mouth daily. Take along with 60 mg daily - total of 80 mg daily 90 capsule 0   DULoxetine (CYMBALTA) 60 MG capsule Take 1 capsule (60 mg total) by mouth daily. Take along with 20 mg daily 90 capsule 0   propranolol (INDERAL) 10 MG tablet Take 1 tablet (10 mg total) by mouth 2 (two) times daily as needed. 180 tablet 0   No current facility-administered medications for this visit.     Musculoskeletal: Strength & Muscle Tone: within normal limits Gait &  Station: normal Patient leans: N/A  Psychiatric Specialty Exam: Review of Systems  Psychiatric/Behavioral:  Positive for sleep disturbance. The patient is nervous/anxious.     Blood pressure 110/70, pulse 96, temperature (!) 97.4 F (36.3 C), temperature source Skin, height 5' 9.5" (1.765 m), weight 294 lb 6.4 oz (133.5 kg), last menstrual period 05/10/2023.Body mass index is 42.85 kg/m.  General Appearance: Casual  Eye Contact:  Fair  Speech:  Clear and Coherent  Volume:  Normal  Mood:  Anxious  Affect:  Congruent  Thought Process:  Goal Directed and Descriptions of Associations: Intact  Orientation:  Full (Time, Place, and Person)  Thought Content: Logical   Suicidal Thoughts:  No  Homicidal Thoughts:  No  Memory:  Immediate;   Fair Recent;   Fair Remote;   Fair  Judgement:  Fair  Insight:  Fair  Psychomotor Activity:  Normal  Concentration:  Concentration: Fair and Attention Span: Fair  Recall:  [employer] of Knowledge: Fair  Language: Fair  Akathisia:  No  Handed:  Right  AIMS (if indicated): not done  Assets:  Communication Skills Desire for  Improvement Housing Social Support  ADL's:  Intact  Cognition: WNL  Sleep:  Poor   Screenings: AIMS    Flowsheet Row [facility]  AIMS Total Score 0      GAD-7    Flowsheet Row Office Visit from 05/24/2023 in [facility] Office Visit from 03/29/2023 in [facility] Counselor from 02/23/2023 in [facility] Office Visit from 02/21/2023 in [facility] Office Visit from 02/15/2023 in [facility]  Total GAD-7 Score 7 13 9 10 11       PHQ2-9    Flowsheet Row Office Visit from 05/24/2023 in [facility] Office Visit from 03/29/2023 in [facility] Counselor from 02/23/2023 in [facility] Office Visit from 02/21/2023 in [facility] Office Visit from 02/15/2023 in [facility]  PHQ-2 Total Score 0 4 4 4 3   PHQ-9 Total Score 0 13 16 16 15       Flowsheet Row Office Visit from 06/21/2023 in [facility] Office Visit from 03/29/2023 in [facility] Counselor from 02/23/2023 in [facility]  C-SSRS RISK CATEGORY Moderate Risk Moderate Risk Error: Q7 should not be populated when Q6 is No        Assessment and Plan: Saman Kelley is a 23 year old African-American female who has a history of MDD, GAD, insomnia was evaluated in office today.  Discussed assessment and plan as noted below.   Generalized anxiety disorder-improving Chronic anxiety exacerbated by family and work stressors. Therapy is beneficial, but symptoms persist. Propranolol is effective but causes  somnolence, leading to increased caffeine intake. Inconsistent Buspar use due to a desire for symptom self-management. Emphasized the importance of consistent medication adherence. - Continue therapy sessions with Ms. Foye Clock - Continue Propranolol 10 mg as needed - Encourage consistent Buspar use, continue BuSpar 5 mg twice daily - Continue Duloxetine 80 mg daily   MDD in partial remission Ongoing depressive symptoms with beneficial therapy. Duloxetine 20 mg daily is effective. Inconsistent Buspar use. Emphasized the importance of consistent medication adherence. - Continue Duloxetine 80 mg daily - Encourage consistent Buspar use,  continue BuSpar 5 mg twice daily. - Monitor depressive symptoms and adjust treatment as needed - Follow up in late April  Insomnia-unstable Difficulty sleeping, likely due to night shifts and stress, affecting focus. Discussed potential benefits of a day job to improve sleep. - Encourage finding a day job to improve sleep - Continue Prazosin 1 mg at bedtime she currently uses it only as needed. - Monitor sleep patterns and adjust treatment as needed   Follow-up - Schedule follow-up appointment for late April  Collaboration of Care: Collaboration of Care: Referral or follow-up with counselor/therapist AEB patient encouraged to continue CBT  Patient/Guardian was advised Release of Information must be obtained prior to any record release in order to collaborate their care with an outside provider. Patient/Guardian was advised if they have not already done so to contact the registration department to sign all necessary forms in order for [place] to release information regarding their care.   Consent: Patient/Guardian gives verbal consent for treatment and assignment of benefits for services provided during this visit. Patient/Guardian expressed understanding and agreed to proceed.   This note was generated in part or whole with voice recognition software. Voice  recognition is usually quite accurate but there are transcription errors that can and very often do occur. I apologize for any typographical errors that were not detected and corrected.    Jomarie Longs, MD 06/22/2023, 11:37 AM

## 2023-06-24 ENCOUNTER — Other Ambulatory Visit: Payer: Self-pay | Admitting: Psychiatry

## 2023-06-24 DIAGNOSIS — F3342 Major depressive disorder, recurrent, in full remission: Secondary | ICD-10-CM

## 2023-06-29 ENCOUNTER — Ambulatory Visit: Payer: BC Managed Care – PPO | Admitting: Licensed Clinical Social Worker

## 2023-06-29 ENCOUNTER — Encounter: Payer: Self-pay | Admitting: Licensed Clinical Social Worker

## 2023-06-29 DIAGNOSIS — F3341 Major depressive disorder, recurrent, in partial remission: Secondary | ICD-10-CM | POA: Diagnosis not present

## 2023-06-29 DIAGNOSIS — F5101 Primary insomnia: Secondary | ICD-10-CM

## 2023-06-29 DIAGNOSIS — F411 Generalized anxiety disorder: Secondary | ICD-10-CM | POA: Diagnosis not present

## 2023-06-29 NOTE — Progress Notes (Signed)
 THERAPIST PROGRESS NOTE  Session Time: 10:21pm-11pm  Participation Level: Active  Behavioral Response: CasualAlertAnxious  Type of Therapy: Individual Therapy  Treatment Goals addressed:  Goal: LTG: Michelle Richardson will score less than 5 on the Generalized Anxiety Disorder 7 Scale (GAD-7)      Dates: Start:  03/02/23    Expected End:  07/28/23       Disciplines: Interdisciplinary, PROVIDER           Goal note from Counselor 06/29/2023 by Renee Ramus B, LCSW     06/29/23: 25% progress; "It's not everyday. Im constantly nervous everyday but its not as bad." Identifies a need to talk about her anxiety in session more.                    Goal: STG: Michelle Richardson will practice problem solving skills 3 times per week for the next 4 weeks.      Dates: Start:  03/02/23    Expected End:  07/28/23       Disciplines: Interdisciplinary, PROVIDER           Goal note from Counselor 06/29/2023 by Renee Ramus B, LCSW     06/29/23: 40% progress; "Before I'd say I just avoided and now I can communicate."                              Template: Depression (OP)         Problem: OP Depression     Dates: Start:  03/02/23       Disciplines: Interdisciplinary, PROVIDER        Goal: LTG: Reduce frequency, intensity, and duration of depression symptoms so that daily functioning is improved     Dates: Start:  03/02/23    Expected End:  07/28/23       Disciplines: Interdisciplinary, PROVIDER           Goal note from Counselor 06/29/2023 by Renee Ramus B, LCSW     06/29/23: 28% progress; "I am just sad sometimes but I don't say anything or address it." Need to address avoidance.                     Goal: STG: Michelle Richardson will identify cognitive patterns and beliefs that support depression     Dates: Start:  03/02/23    Expected End:  07/28/23       Disciplines: Interdisciplinary, PROVIDER           Goal note from Counselor 06/29/2023 by Renee Ramus B, LCSW     06/29/23: 15% progress "I still  have them but their are a lot of things around me that make me depressed like my job."                     Goal: STG: Michelle Richardson will practice behavioral activation skills 2 times per week for the next 8 weeks     Dates: Start:  03/02/23    Expected End:  07/28/23       Disciplines: Interdisciplinary, PROVIDER           Goal note from Counselor 06/29/2023 by Renee Ramus B, LCSW     06/29/23: 30% progress; "Coping skills is talking, setting boundaries, reading, and painting."  Template: Social Interpersonal Effectiveness (OP)         Problem: Social Interpersonal Effectiveness     Dates: Start:  03/02/23       Disciplines: Interdisciplinary, PROVIDER        Goal: LTG: Michelle Richardson will attend and participate in therapeutic, recreational and educational activities that support interpersonal effectiveness                              Goal: LTG: Michelle Richardson will recognize socially inappropriate behaviors and develop alternative behaviors     Dates: Start:  03/02/23    Expected End:  07/28/23       Disciplines: Interdisciplinary, PROVIDER           Goal note from Counselor 06/29/2023 by Renee Ramus B, LCSW     06/29/23: 5% progress; "addressing my emotions and talking about them." Need to address communication.                     Goal: STG: Michelle Richardson will identify 2 behaviors that engage in social isolation      Dates: Start:  03/02/23    Expected End:  07/28/23       Disciplines: Interdisciplinary, PROVIDER           Goal note from Counselor 06/29/2023 by Renee Ramus B, LCSW     06/29/23: 2% progress; "I think it's good to be by yourself but sometimes I take it to the extreme by going a few days without interacting with people unless I have to."                ProgressTowards Goals: Progressing  Interventions: CBT, Assertiveness Training, Supportive, and Reframing  Summary: Michelle Richardson is a 23 y.o. female who presents with symptoms  of anxiety and depression including but not limited to uncontrollable worry, anxious feelings, pressurized speech, restlessness, low mood. Pt was oriented times 5. Pt was cooperative and engaged. Pt denies SI/HI/AVH.     The patient utilized therapeutic spaces to process increased stress, citing a series of personal issues contributing to her low mood. She reported being unable to sleep for several days due to her work schedule and a rise in nightmares. Reflected on controllable factors with patient expressing a desire to move jobs.   Patient continued to work on processing her role within the family and exploring ways for her to utilize assertive communication to establish healthy boundaries. The patient expressed that she feels like the main caretaker for her family and places significant pressure on herself. She also feels that her family continues to view her as a child, making it difficult for her to seek independence.   Discussed how her struggles to establish healthy boundaries by saying "no" are interfering with her ability to take care of herself. The patient became tearful, reflecting on her overwhelming feelings, and stated she is not used to having a safe space to process her emotions, as she is accustomed to caring for everyone else.  The clinician will continued to work with the patient on challenging patterns of avoidance by reviewing her treatment plan as seen above and assessing the progress and goals established at the beginning of therapy.   Suicidal/Homicidal: Nowithout intent/plan  Therapist Response:  Clinician utilized active and supportive reflection to create a safe space for patient to process recent life events. Clinician assessed for current symptoms, stressors, safety since last session. Clinician continued to address ways in  which patient can establish healthy boundaries and utilize assertive communication. Assessed patients progress in therapy by reviewing her TP.    Plan: Return again in 2 weeks.  Diagnosis: Recurrent major depressive disorder, in partial remission (HCC)  Generalized anxiety disorder  Primary insomnia   Collaboration of Care: AEB psychiatrist can access notes and cln. Will review psychiatrists' notes. Check in with the patient and will see LCSW per availability. Patient agreed with treatment recommendations.   Patient/Guardian was advised Release of Information must be obtained prior to any record release in order to collaborate their care with an outside provider. Patient/Guardian was advised if they have not already done so to contact the registration department to sign all necessary forms in order for [place] to release information regarding their care.   Consent: Patient/Guardian gives verbal consent for treatment and assignment of benefits for services provided during this visit. Patient/Guardian expressed understanding and agreed to proceed.   Dereck Leep, LCSW 06/29/2023

## 2023-06-29 NOTE — Addendum Note (Signed)
 Addended by: Renee Ramus B on: 06/29/2023 01:21 PM   Modules accepted: Level of Service

## 2023-06-29 NOTE — Progress Notes (Signed)
 Clinician attempted session via face-to-face, but Michelle Richardson did not appear for her session.  Patient will be charged a no-show fee per [employer] health attendance policy.

## 2023-07-12 ENCOUNTER — Ambulatory Visit (INDEPENDENT_AMBULATORY_CARE_PROVIDER_SITE_OTHER): Payer: BC Managed Care – PPO | Admitting: Licensed Clinical Social Worker

## 2023-07-12 DIAGNOSIS — Z91199 Patient's noncompliance with other medical treatment and regimen due to unspecified reason: Secondary | ICD-10-CM

## 2023-07-12 NOTE — Progress Notes (Signed)
 Clinician attempted session via face-to-face, but Michelle Richardson did not appear for her session. Attendance warning letter was sent to the patient to respond by April1st.

## 2023-07-12 NOTE — Progress Notes (Deleted)
   THERAPIST PROGRESS NOTE  Session Time: ***  Participation Level: Active  Behavioral Response: CasualAlertEuthymic  Type of Therapy: Individual Therapy  Treatment Goals addressed:  Pt stated that she wants to cope with her anxiety                                        Goal: Anxiety             Description: Reduce overall frequency, intensity and duration of anxiety so that daily functioning is not impaired per pt self report 3 out of 5 sessions.                              Goal: LTG: Reduce frequency, intensity, and duration of depression symptoms so that daily functioning is improved                                                          Goal: Depression             Description: Develop healthy thinking patterns about self, others and beliefs about self to help alleviate symptoms of depression per pt report 3 out 5 sessions.       ProgressTowards Goals: Progressing  Interventions: {CHL AMB BH Type of Intervention:[id number]}  Summary: Michelle Richardson is a 23 y.o. female who presents with symptoms of anxiety and depression including but not limited to uncontrollable worry, anxious feelings, pressurized speech, restlessness, low mood. Pt was oriented times 5. Pt was cooperative and engaged. Pt denies SI/HI/AVH.   Suicidal/Homicidal: Nowithout intent/plan  Therapist Response: Clinician utilized active and supportive reflection to create a safe space for patient to process recent life events. Clinician assessed for current symptoms, stressors, safety since last session. Clinician continued to address ways in which patient can establish healthy boundaries and utilize assertive communication.   Plan: Return again in 2 weeks.  Diagnosis: Generalized anxiety disorder  Recurrent major depressive disorder, in partial remission (HCC)   Collaboration of Care: AEB psychiatrist can access notes and cln. Will review psychiatrists' notes. Check in with the patient and will see  LCSW per availability. Patient agreed with treatment recommendations.   Patient/Guardian was advised Release of Information must be obtained prior to any record release in order to collaborate their care with an outside provider. Patient/Guardian was advised if they have not already done so to contact the registration department to sign all necessary forms in order for [place] to release information regarding their care.   Consent: Patient/Guardian gives verbal consent for treatment and assignment of benefits for services provided during this visit. Patient/Guardian expressed understanding and agreed to proceed.   Dereck Leep, LCSW 07/12/2023

## 2023-07-24 ENCOUNTER — Ambulatory Visit: Payer: BC Managed Care – PPO | Admitting: Licensed Clinical Social Worker

## 2023-07-24 DIAGNOSIS — F411 Generalized anxiety disorder: Secondary | ICD-10-CM

## 2023-07-24 DIAGNOSIS — F5101 Primary insomnia: Secondary | ICD-10-CM

## 2023-07-24 DIAGNOSIS — F3341 Major depressive disorder, recurrent, in partial remission: Secondary | ICD-10-CM | POA: Diagnosis not present

## 2023-07-24 NOTE — Progress Notes (Signed)
 THERAPIST PROGRESS NOTE  Session Time: 10:02 -11am  Participation Level: Active  Behavioral Response: CasualAlertEuthymic  Type of Therapy: Individual Therapy  Treatment Goals addressed:      Goal: LTG: Michelle Richardson will score less than 5 on the Generalized Anxiety Disorder 7 Scale (GAD-7)      Dates: Start:  03/02/23    Expected End:  07/28/23       Disciplines: Interdisciplinary, PROVIDER              Goal note from Counselor 06/29/2023 by Renee Ramus B, LCSW     06/29/23: 25% progress; "It's not everyday. Im constantly nervous everyday but its not as bad." Identifies a need to talk about her anxiety in session more.                       Goal: STG: Michelle Richardson will practice problem solving skills 3 times per week for the next 4 weeks.      Dates: Start:  03/02/23    Expected End:  07/28/23       Disciplines: Interdisciplinary, PROVIDER              Goal note from Counselor 06/29/2023 by Renee Ramus B, LCSW     06/29/23: 40% progress; "Before I'd say I just avoided and now I can communicate."                                     Template: Depression (OP)             Problem: OP Depression     Dates: Start:  03/02/23       Disciplines: Interdisciplinary, PROVIDER           Goal: LTG: Reduce frequency, intensity, and duration of depression symptoms so that daily functioning is improved     Dates: Start:  03/02/23    Expected End:  07/28/23       Disciplines: Interdisciplinary, PROVIDER              Goal note from Counselor 06/29/2023 by Renee Ramus B, LCSW     06/29/23: 28% progress; "I am just sad sometimes but I don't say anything or address it." Need to address avoidance.                        Goal: STG: Michelle Richardson will identify cognitive patterns and beliefs that support depression     Dates: Start:  03/02/23    Expected End:  07/28/23       Disciplines: Interdisciplinary, PROVIDER              Goal note from Counselor 06/29/2023 by Renee Ramus B,  LCSW     06/29/23: 15% progress "I still have them but their are a lot of things around me that make me depressed like my job."                        Goal: STG: Michelle Richardson will practice behavioral activation skills 2 times per week for the next 8 weeks     Dates: Start:  03/02/23    Expected End:  07/28/23       Disciplines: Interdisciplinary, PROVIDER              Goal note from Counselor 06/29/2023 by Dereck Leep, LCSW  06/29/23: 30% progress; "Coping skills is talking, setting boundaries, reading, and painting."                                         Template: Social Interpersonal Effectiveness (OP)             Problem: Social Interpersonal Effectiveness     Dates: Start:  03/02/23       Disciplines: Interdisciplinary, PROVIDER           Goal: LTG: Michelle Richardson will attend and participate in therapeutic, recreational and educational activities that support interpersonal effectiveness                                      Goal: LTG: Michelle Richardson will recognize socially inappropriate behaviors and develop alternative behaviors     Dates: Start:  03/02/23    Expected End:  07/28/23       Disciplines: Interdisciplinary, PROVIDER              Goal note from Counselor 06/29/2023 by Renee Ramus B, LCSW     06/29/23: 5% progress; "addressing my emotions and talking about them." Need to address communication.                        Goal: STG: Michelle Richardson will identify 2 behaviors that engage in social isolation      Dates: Start:  03/02/23    Expected End:  07/28/23       Disciplines: Interdisciplinary, PROVIDER              Goal note from Counselor 06/29/2023 by Renee Ramus B, LCSW     06/29/23: 2% progress; "I think it's good to be by yourself but sometimes I take it to the extreme by going a few days without interacting with people unless I have to."             ProgressTowards Goals: Progressing  Interventions: CBT, Assertiveness Training, Supportive, and  Reframing  Summary: Michelle Richardson is a 23 y.o. female who presents with symptoms of anxiety and depression including but not limited to uncontrollable worry, anxious feelings, pressurized speech, restlessness, low mood. Pt was oriented times 5. Pt was cooperative and engaged. Pt denies SI/HI/AVH.     The clinician addressed the patient's attendance and noted that an attendance warning letter was sent to her home and via MyChart. The patient was warned that if she misses or no-shows for one more appointment, she may be discharged from the practice.  The patient identified recent additional stressors, including the sudden and unexpected deaths of her uncle and cousin.  During the session, the clinician observed that the patient exhibited a deflated affect, monotone speech, avoidant eye contact, and tearfulness. The patient reported experiencing difficulties in various relationships due to her efforts to establish boundaries. They explored how the patient has used assertive communication to address wrongdoings by others in her life. The patient mentioned that individuals in her support system have become distant as a result of her boundary-setting efforts. They discussed how these dynamics are impacting the patient's mental health. The patient expressed feeling that everyone around her is draining her energy and cited her goals to move out on her own. They explored her feelings of outgrowing certain friendships.  The clinician and patient  also continued to discuss the current romantic relationship and the patterns that make the patient feel controlled by someone else. Examined how past experiences from previous relationships affects others' ability to trust her. Together, they explored ways the patient can engage in self-care, and she identified a desire to take two days for herself, listing activities that she believes would benefit her well-being.  Suicidal/Homicidal: Nowithout intent/plan  Therapist  Response: Clinician utilized active and supportive reflection to create a safe space for patient to process recent life events. Clinician assessed for current symptoms, stressors, safety since last session. Clinician continued to address ways in which patient can establish healthy boundaries and utilize assertive communication.  Continue to explore assertive communication and establishment of healthy boundaries.  Clinician challenged patient to incorporate self-care into her routine.  Plan: Return again in 2 weeks.  Diagnosis: Recurrent major depressive disorder, in partial remission (HCC)  Generalized anxiety disorder  Primary insomnia   Collaboration of Care: AEB psychiatrist can access notes and cln. Will review psychiatrists' notes. Check in with the patient and will see LCSW per availability. Patient agreed with treatment recommendations.   Patient/Guardian was advised Release of Information must be obtained prior to any record release in order to collaborate their care with an outside provider. Patient/Guardian was advised if they have not already done so to contact the registration department to sign all necessary forms in order for [place] to release information regarding their care.   Consent: Patient/Guardian gives verbal consent for treatment and assignment of benefits for services provided during this visit. Patient/Guardian expressed understanding and agreed to proceed.   Dereck Leep, LCSW 07/24/2023

## 2023-08-08 ENCOUNTER — Ambulatory Visit: Payer: BC Managed Care – PPO | Admitting: Licensed Clinical Social Worker

## 2023-08-08 DIAGNOSIS — F411 Generalized anxiety disorder: Secondary | ICD-10-CM

## 2023-08-08 DIAGNOSIS — F3341 Major depressive disorder, recurrent, in partial remission: Secondary | ICD-10-CM

## 2023-08-08 NOTE — Progress Notes (Signed)
 THERAPIST PROGRESS NOTE  Session Time: 10:04am-10:55am  Participation Level: Active  Behavioral Response: CasualAlertEuthymic  Type of Therapy: Individual Therapy  Treatment Goals addressed:  Goal: LTG: Maytal will score less than 5 on the Generalized Anxiety Disorder 7 Scale (GAD-7)       Dates: Start:  03/02/23    Expected End:  07/28/23        Disciplines: Interdisciplinary, PROVIDER                                              Goal: STG: Shantea will practice problem solving skills 3 times per week for the next 4 weeks.       Dates: Start:  03/02/23    Expected End:  07/28/23        Disciplines: Interdisciplinary, PROVIDER                                                                Template: Depression (OP)                                                Goal: LTG: Reduce frequency, intensity, and duration of depression symptoms so that daily functioning is improved      Dates: Start:  03/02/23    Expected End:  07/28/23        Disciplines: Interdisciplinary, PROVIDER                                              Goal: STG: Ivorie will identify cognitive patterns and beliefs that support depression      Dates: Start:  03/02/23    Expected End:  07/28/23        Disciplines: Interdisciplinary, PROVIDER                                  Goal: STG: Mersadies will practice behavioral activation skills 2 times per week for the next 8 weeks      Dates: Start:  03/02/23    Expected End:  07/28/23        Disciplines: Interdisciplinary, PROVIDER                                                   Template: Social Interpersonal Effectiveness (OP)                                               Goal: LTG: Sharaine will attend and participate in therapeutic, recreational and educational activities that support interpersonal effectiveness  Goal: LTG: Graceann will recognize socially inappropriate  behaviors and develop alternative behaviors      Dates: Start:  03/02/23    Expected End:  07/28/23        Disciplines: Interdisciplinary, PROVIDER                               Goal: STG: Denys will identify 2 behaviors that engage in social isolation       Dates: Start:  03/02/23    Expected End:  07/28/23        Disciplines: Interdisciplinary, PROVIDER                    ProgressTowards Goals: Progressing  Interventions: CBT, Strength-based, Supportive, and Reframing  Summary: Michelle Richardson is a 23 y.o. female who presents with symptoms of anxiety and depression including but not limited to uncontrollable worry, anxious feelings, pressurized speech, restlessness, low mood. Pt was oriented times 5. Pt was cooperative and engaged. Pt denies SI/HI/AVH.    The patient utilized the therapeutic space to process recent life events, expressing that she feels overwhelmed by her recent break-up with her significant other. She identified that she has made efforts to take personal time for herself, as discussed in previous sessions, which had become a point of contention in her relationship.   The clinician and the patient processed the stages of grief and explored which stages she has progressed through since the break-up. They addressed the ways in which the patient is coping and seeking further support from trusted individuals. The clinician and the patient reviewed red flag behaviors that the patient recognized she had missed during the relationship.   Discussed how her partner's own issues created barriers to trust and engagement within the relationship. Additionally, the clinician and the patient reflected on the lessons she has learned about relationships and herself throughout this experience.  Suicidal/Homicidal: Nowithout intent/plan  Therapist Response: Clinician utilized active and supportive reflection to create a safe space for patient to process recent life events. Clinician  assessed for current symptoms, stressors, safety since last session.  Offered support as patient reflected on her experience through the grieving process.  Reflected on red and green flags within relationships.  Plan: Return again in 2 weeks.  Diagnosis:Recurrent major depressive disorder, in partial remission (HCC)  Generalized anxiety disorder   Collaboration of Care: AEB psychiatrist can access notes and cln. Will review psychiatrists' notes. Check in with the patient and will see LCSW per availability. Patient agreed with treatment recommendations.   Patient/Guardian was advised Release of Information must be obtained prior to any record release in order to collaborate their care with an outside provider. Patient/Guardian was advised if they have not already done so to contact the registration department to sign all necessary forms in order for us  to release information regarding their care.   Consent: Patient/Guardian gives verbal consent for treatment and assignment of benefits for services provided during this visit. Patient/Guardian expressed understanding and agreed to proceed.   Marvin Slot, LCSW 08/08/2023

## 2023-08-09 ENCOUNTER — Ambulatory Visit (INDEPENDENT_AMBULATORY_CARE_PROVIDER_SITE_OTHER): Payer: Self-pay

## 2023-08-09 DIAGNOSIS — Z3042 Encounter for surveillance of injectable contraceptive: Secondary | ICD-10-CM

## 2023-08-09 MED ORDER — MEDROXYPROGESTERONE ACETATE 150 MG/ML IM SUSY
150.0000 mg | PREFILLED_SYRINGE | INTRAMUSCULAR | Status: AC
  Administered 2023-08-09: 150 mg via INTRAMUSCULAR

## 2023-08-09 NOTE — Progress Notes (Signed)
 Patient is in office today for a nurse visit for Birth Control Injection. Patient Injection was given in the  Left quadriceps. Patient tolerated injection well.

## 2023-08-22 ENCOUNTER — Ambulatory Visit: Admitting: Licensed Clinical Social Worker

## 2023-08-22 DIAGNOSIS — F411 Generalized anxiety disorder: Secondary | ICD-10-CM

## 2023-08-22 DIAGNOSIS — F3341 Major depressive disorder, recurrent, in partial remission: Secondary | ICD-10-CM | POA: Diagnosis not present

## 2023-08-22 NOTE — Progress Notes (Signed)
 THERAPIST PROGRESS NOTE  Session Time: 11:01am-12:05pm  Participation Level: Active  Behavioral Response: CasualAlertEuthymic  Type of Therapy: Individual Therapy  Treatment Goals addressed:  Goal: LTG: Kayelee will score less than 5 on the Generalized Anxiety Disorder 7 Scale (GAD-7)        Dates: Start:  03/02/23    Expected End:  07/28/23         Disciplines: Interdisciplinary, PROVIDER                                                                     Goal: STG: Clea will practice problem solving skills 3 times per week for the next 4 weeks.         Dates: Start:  03/02/23    Expected End:  07/28/23          Disciplines: Interdisciplinary, PROVIDER                                                                                            Template: Depression (OP)                                                                              Goal: LTG: Reduce frequency, intensity, and duration of depression symptoms so that daily functioning is improved       Dates: Start:  03/02/23    Expected End:  07/28/23         Disciplines: Interdisciplinary, PROVIDER                                                          Goal: STG: Marchella will identify cognitive patterns and beliefs that support depression       Dates: Start:  03/02/23    Expected End:  07/28/23         Disciplines: Interdisciplinary, PROVIDER                                           Goal: STG: Treana will practice behavioral activation skills 2 times per week for the next 8 weeks       Dates: Start:  03/02/23    Expected End:  07/28/23         Disciplines: Interdisciplinary, PROVIDER  Template: Social Interpersonal Effectiveness (OP)                                                                                    Goal: LTG: Harveen will attend and participate in therapeutic, recreational and  educational activities that support interpersonal effectiveness                                                   Goal: LTG: Allea will recognize socially inappropriate behaviors and develop alternative behaviors       Dates: Start:  03/02/23    Expected End:  07/28/23         Disciplines: Interdisciplinary, PROVIDER                                           Goal: STG: Shandiin will identify 2 behaviors that engage in social isolation        Dates: Start:  03/02/23    Expected End:  07/28/23         Disciplines: Interdisciplinary, PROVIDER            ProgressTowards Goals: Progressing  Interventions: CBT, Solution Focused, Assertiveness Training, Supportive, and Reframing  Summary: Michelle Richardson is a 23 y.o. female who presents with symptoms of anxiety and depression including but not limited to uncontrollable worry, anxious feelings, pressurized speech, restlessness, low mood. Pt was oriented times 5. Pt was cooperative and engaged. Pt denies SI/HI/AVH.   The patient used the therapeutic session to process her grief related to a recent breakup. She recognizes her progress in understanding the healthy and unhealthy components of the relationship. Additionally, she continues to reflect on the loss of a close friendship that faded as she and her friend grew apart. The patient expressed concerns about how others perceive her and how this affects her ability to address misplaced guilt. She reports that she has been working on establishing healthier boundaries by asserting herself and telling others "no."   The patient also reflected on the relationships in which her boundaries were respected versus those where they were not. She recalled experiences where trusting her instincts proved to be the most effective approach.   The patient has identified plans and intentions to apply to college, which will help her prepare to move out on her own. She reflected on the current dynamics at home and her  relationships with family members she lives with. The patient feels unheard and, as a result, disrespected. The clinician continues to work with her on challenging her perceptions regarding taking on a maternal role in the household versus maintaining her role as a child with her mother and siblings.  Suicidal/Homicidal: Nowithout intent/plan  Therapist Response: Clinician utilized active and supportive reflection to create a safe space for patient to process recent life events. Clinician assessed for current symptoms, stressors, safety since last session.  Recalled healthy versus unhealthy boundaries.  Reflected on red and  green flags within relationships.   Plan: Return again in 2 weeks.  Diagnosis: Recurrent major depressive disorder, in partial remission (HCC)  Generalized anxiety disorder   Collaboration of Care: AEB psychiatrist can access notes and cln. Will review psychiatrists' notes. Check in with the patient and will see LCSW per availability. Patient agreed with treatment recommendations.   Patient/Guardian was advised Release of Information must be obtained prior to any record release in order to collaborate their care with an outside provider. Patient/Guardian was advised if they have not already done so to contact the registration department to sign all necessary forms in order for us  to release information regarding their care.   Consent: Patient/Guardian gives verbal consent for treatment and assignment of benefits for services provided during this visit. Patient/Guardian expressed understanding and agreed to proceed.   Marvin Slot, LCSW 08/22/2023

## 2023-08-23 ENCOUNTER — Ambulatory Visit: Payer: BC Managed Care – PPO | Admitting: Psychiatry

## 2023-08-23 ENCOUNTER — Encounter: Payer: Self-pay | Admitting: Psychiatry

## 2023-08-23 VITALS — BP 118/84 | HR 78 | Temp 98.6°F | Ht 69.5 in | Wt 293.0 lb

## 2023-08-23 DIAGNOSIS — F3341 Major depressive disorder, recurrent, in partial remission: Secondary | ICD-10-CM | POA: Diagnosis not present

## 2023-08-23 DIAGNOSIS — F5101 Primary insomnia: Secondary | ICD-10-CM

## 2023-08-23 DIAGNOSIS — F411 Generalized anxiety disorder: Secondary | ICD-10-CM

## 2023-08-23 MED ORDER — DULOXETINE HCL 20 MG PO CPEP: 20.0000 mg | ORAL_CAPSULE | Freq: Every day | ORAL | 0 refills | Status: DC

## 2023-08-23 MED ORDER — DULOXETINE HCL 60 MG PO CPEP
60.0000 mg | ORAL_CAPSULE | Freq: Every day | ORAL | 0 refills | Status: DC
Start: 2023-08-23 — End: 2023-11-20
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
## 2023-09-04 ENCOUNTER — Ambulatory Visit: Payer: BC Managed Care – PPO | Admitting: Psychology

## 2023-09-05 ENCOUNTER — Ambulatory Visit: Admitting: Licensed Clinical Social Worker

## 2023-09-05 DIAGNOSIS — F411 Generalized anxiety disorder: Secondary | ICD-10-CM | POA: Diagnosis not present

## 2023-09-05 DIAGNOSIS — F3341 Major depressive disorder, recurrent, in partial remission: Secondary | ICD-10-CM

## 2023-09-05 NOTE — Progress Notes (Signed)
 THERAPIST PROGRESS NOTE  Session Time: 1:04pm-2:01pm  Participation Level: Active  Behavioral Response: CasualAlertEuthymic  Type of Therapy: Individual Therapy  Treatment Goals addressed:  Goal: LTG: Dnasia will score less than 5 on the Generalized Anxiety Disorder 7 Scale (GAD-7)         Dates: Start:  03/02/23    Expected End:  07/28/23          Disciplines: Interdisciplinary, PROVIDER                                                                                          Goal: STG: Leeona will practice problem solving skills 3 times per week for the next 4 weeks.           Dates: Start:  03/02/23    Expected End:  07/28/23            Disciplines: Interdisciplinary, PROVIDER                                                                                                                           Template: Depression (OP)                                                                                    Goal: LTG: Reduce frequency, intensity, and duration of depression symptoms so that daily functioning is improved       Dates: Start:  03/02/23    Expected End:  07/28/23         Disciplines: Interdisciplinary, PROVIDER                                                          Goal: STG: Kimorah will identify cognitive patterns and beliefs that support depression       Dates: Start:  03/02/23    Expected End:  07/28/23         Disciplines: Interdisciplinary, PROVIDER  Goal: STG: Nevae will practice behavioral activation skills 2 times per week for the next 8 weeks       Dates: Start:  03/02/23    Expected End:  07/28/23         Disciplines: Interdisciplinary, PROVIDER                                                                              Template: Social Interpersonal Effectiveness (OP)                                                                                                         Goal: LTG: Zoa will attend and participate in therapeutic, recreational and educational activities that support interpersonal effectiveness                                                   Goal: LTG: Oswin will recognize socially inappropriate behaviors and develop alternative behaviors       Dates: Start:  03/02/23    Expected End:  07/28/23         Disciplines: Interdisciplinary, PROVIDER                                           Goal: STG: Allexa will identify 2 behaviors that engage in social isolation        Dates: Start:  03/02/23    Expected End:  07/28/23         Disciplines: Interdisciplinary, PROVIDER                 ProgressTowards Goals: Progressing  Interventions: CBT, Supportive, and Reframing  Summary:  Michelle Richardson is a 23 y.o. female who presents with symptoms of anxiety and depression including but not limited to uncontrollable worry, anxious feelings, racing thoughts, difficulty concentrating, restlessness, low mood. Pt was oriented times 5. Pt was cooperative and engaged. Pt denies SI/HI/AVH.   The patient utilized the therapeutic space to process her sadness resulting from the loss of relationships with both her best friend and boyfriend within the same month. She identified that due to the high level of stress she is experiencing, she has been vomiting daily and has difficulty with nausea.  During the assessment, the clinician evaluated the patient's anxiety level. The patient reported feeling as though there was a football player on her chest and described a knot in her throat that made her feel as if she were about to cry. The clinician educated the patient on mindfulness techniques, such as 7-11 breathing  and the 5 senses grounding exercise. The patient reflected on how each of these techniques helped decrease her anxiety and stress, allowing her to feel more relaxed. Together, the clinician and patient discussed ways for her to engage in  self-care.  The clinician will continue to explore the patient's patterns of caring for others to the detriment of her own self-care. The patient identified that she often lives by negative beliefs such as "I cannot trust others" and "I have to control everything." She also mentioned feeling uncomfortable expressing her feelings because she does not feel safe doing so. The clinician worked with the patient to challenge these negative perceptions and introduced [employer] Desensitization and Reprocessing (EMDR). The patient consented to begin EMDR treatment in upcoming sessions and reflected on how childhood traumatic events have contributed to her negative beliefs.  Suicidal/Homicidal: Nowithout intent/plan  Therapist Response: Clinician utilized active and supportive reflection to create a safe space for patient to process recent life events. Clinician assessed for current symptoms, stressors, safety since last session.  Utilized CBT to assist patient in reframing negative cognitions.  Educated patient on mindfulness techniques to regulate stress and anxiety.    Plan: Return again in 2 weeks.  Diagnosis: Recurrent major depressive disorder, in partial remission (HCC)  Generalized anxiety disorder   Collaboration of Care: AEB psychiatrist can access notes and cln. Will review psychiatrists' notes. Check in with the patient and will see LCSW per availability. Patient agreed with treatment recommendations.   Patient/Guardian was advised Release of Information must be obtained prior to any record release in order to collaborate their care with an outside provider. Patient/Guardian was advised if they have not already done so to contact the registration department to sign all necessary forms in order for us  to release information regarding their care.   Consent: Patient/Guardian gives verbal consent for treatment and assignment of benefits for services provided during this visit. Patient/Guardian  expressed understanding and agreed to proceed.   Marvin Slot, LCSW 09/05/2023

## 2023-09-26 ENCOUNTER — Other Ambulatory Visit: Payer: Self-pay | Admitting: Psychiatry

## 2023-09-26 DIAGNOSIS — F3342 Major depressive disorder, recurrent, in full remission: Secondary | ICD-10-CM

## 2023-09-28 ENCOUNTER — Ambulatory Visit: Admitting: Licensed Clinical Social Worker

## 2023-09-28 DIAGNOSIS — F411 Generalized anxiety disorder: Secondary | ICD-10-CM | POA: Diagnosis not present

## 2023-09-28 DIAGNOSIS — F3341 Major depressive disorder, recurrent, in partial remission: Secondary | ICD-10-CM | POA: Diagnosis not present

## 2023-09-28 NOTE — Progress Notes (Signed)
 THERAPIST PROGRESS NOTE  Session Time: 8-8:58am  Participation Level: Active  Behavioral Response: CasualAlertEuthymic  Type of Therapy: Individual Therapy  Treatment Goals addressed:  Goal: LTG: Olene will score less than 5 on the Generalized Anxiety Disorder 7 Scale (GAD-7)                                                                                                           Goal: STG: Ronette will practice problem solving skills 3 times per week for the next 4 weeks.                                                                                                                                                 Template: Depression (OP)                                                                               Goal: LTG: Reduce frequency, intensity, and duration of depression symptoms so that daily functioning is improved                                                     Goal: STG: Cloyce will identify cognitive patterns and beliefs that support depression                                                                                   Template: Social Interpersonal Effectiveness (OP)  Goal: LTG: Hadlee will attend and participate in therapeutic, recreational and educational activities that support interpersonal effectiveness                                                   Goal: LTG: Shemia will recognize socially inappropriate behaviors and develop alternative behaviors                                                      Goal: STG: Vester will identify 2 behaviors that engage in social isolation                                     ProgressTowards Goals: Progressing  Interventions: CBT, Solution Focused, Supportive, and Reframing  Summary: Michelle Richardson is a 23 y.o. female who  presents with symptoms of anxiety and depression including but not limited to uncontrollable worry, anxious feelings, racing thoughts, difficulty concentrating, restlessness, low mood. Pt was oriented times 5. Pt was cooperative and engaged. Pt denies SI/HI/AVH.   The patient utilized the therapeutic space to process the barriers she has encountered in communicating her boundaries to those closest to her. She identified that others are concerned about her presentation and reported hostility. The patient expressed feelings of loneliness and acknowledged a tendency to shut down and isolate herself. She reported limited socialization, mentioning only two friends, and identified her need for independence as a contributing factor to this isolation.  During the session, the patient reflected on her behaviors in previous relationships and acknowledged her struggles with accepting help. She shared that she received messages about weakness during her childhood. The clinician worked with her on reframing this perspective. The patient also recognized that she often experiences significant self-doubt due to the messages she has received from others in her life, which is becoming a barrier to her pursuing her college education. Additionally, she expressed a fear of failure as a negative thought regarding her college aspirations.  Together, the patient and clinician identified controllable factors, with the patient considering ways to engage in her hobbies. The patient also began working with the clinician to challenge her negative beliefs about investing in herself. For homework, she will reach out to the financial aid department at the school she is interested in attending and create a pro and con list regarding her pursuit of a college education.   Suicidal/Homicidal: Nowithout intent/plan  Therapist Response: Clinician utilized active and supportive reflection to create a safe space for patient to process recent  life events. Clinician assessed for current symptoms, stressors, safety since last session.  Worked with patient through CBT techniques to challenge negative cognitions as well as engaging in problem solving to address hesitancy and taking steps to better her future.  Plan: Return again in 2 weeks.  Diagnosis: Recurrent major depressive disorder, in partial remission (HCC)  Generalized anxiety disorder   Collaboration of Care: AEB psychiatrist can access notes and cln. Will review psychiatrists' notes. Check in with the patient and will see LCSW per availability. Patient agreed with treatment recommendations.   Patient/Guardian was advised Release of Information must be obtained prior to any  record release in order to collaborate their care with an outside provider. Patient/Guardian was advised if they have not already done so to contact the registration department to sign all necessary forms in order for us  to release information regarding their care.   Consent: Patient/Guardian gives verbal consent for treatment and assignment of benefits for services provided during this visit. Patient/Guardian expressed understanding and agreed to proceed.   Marvin Slot, LCSW 09/28/2023

## 2023-10-10 ENCOUNTER — Ambulatory Visit: Admitting: Licensed Clinical Social Worker

## 2023-10-10 DIAGNOSIS — F3341 Major depressive disorder, recurrent, in partial remission: Secondary | ICD-10-CM | POA: Diagnosis not present

## 2023-10-10 DIAGNOSIS — F411 Generalized anxiety disorder: Secondary | ICD-10-CM | POA: Diagnosis not present

## 2023-10-10 NOTE — Progress Notes (Signed)
 THERAPIST PROGRESS NOTE  Session Time: 9-10am  Participation Level: Active  Behavioral Response: CasualAlertEuthymic  Type of Therapy: Individual Therapy  Goal: LTG: Michelle Richardson will score less than 5 on the Generalized Anxiety Disorder 7 Scale (GAD-7)                                                                                                                              Goal: STG: Michelle Richardson will practice problem solving skills 3 times per week for the next 4 weeks.                                                                                                                                                                                        Template: Depression (OP)                                                                                        Goal: LTG: Reduce frequency, intensity, and duration of depression symptoms so that daily functioning is improved                                                              Goal: STG: Michelle Richardson will identify cognitive patterns and beliefs that support depression  Template: Social Interpersonal Effectiveness (OP)                                                                                                                                                                    Goal: LTG: Michelle Richardson will attend and participate in therapeutic, recreational and educational activities that support interpersonal effectiveness                                                   Goal: LTG: Michelle Richardson will recognize socially inappropriate behaviors and develop alternative behaviors                                                                Goal: STG: Michelle Richardson will identify 2 behaviors that engage in social isolation                                            ProgressTowards Goals:  Progressing   Interventions: CBT, Solution Focused, Supportive, and Reframing   Summary: Michelle Richardson is a 23 y.o. female who presents with symptoms of anxiety and depression including but not limited to uncontrollable worry, anxious feelings, racing thoughts, difficulty concentrating, restlessness, low mood. Pt was oriented times 5. Pt was cooperative and engaged. Pt denies SI/HI/AVH.   During the therapeutic session, the patient continued to process her recent breakup. She identified several moments of self-reflection and regret related to her lack of transparency about her feelings during the relationship. The patient expressed difficulty in coping with the loss of her sense of safety in the relationship, stemming from a fear of being judged based on others' opinions.  She recognized familial patterns that contribute to her guardedness when showing affection and love to others. Additionally, she noted that when she feels people getting too close, her typical coping mechanism is to withdraw and avoid them. The patient reflected on the similarities in both her and her ex-boyfriend's struggles with trust and the inability to communicate their core issues effectively.  The clinician encouraged the patient to consider how her need for self-preservation affects both her romantic and personal relationships. They worked together on Administrator a no-send letter to her ex-boyfriend as a means of finding closure, which she plans to share in the next session.  Suicidal/Homicidal: Nowithout intent/plan  Therapist Response: Clinician utilized active and supportive reflection to create a safe space for patient to process recent life events. Clinician assessed for current symptoms, stressors, safety since last session.  Reflected on patient's newfound realizations on her ability to participate in healthy relationships as well as fear of letting others get close to her.  Went through CBT on challenging negative  cognitions regarding trust.   Plan: Return again in 2 weeks.  Diagnosis: Recurrent major depressive disorder, in partial remission (HCC)  Generalized anxiety disorder    Collaboration of Care: AEB psychiatrist can access notes and cln. Will review psychiatrists' notes. Check in with the patient and will see LCSW per availability. Patient agreed with treatment recommendations.   Patient/Guardian was advised Release of Information must be obtained prior to any record release in order to collaborate their care with an outside provider. Patient/Guardian was advised if they have not already done so to contact the registration department to sign all necessary forms in order for us  to release information regarding their care.   Consent: Patient/Guardian gives verbal consent for treatment and assignment of benefits for services provided during this visit. Patient/Guardian expressed understanding and agreed to proceed.   Marvin Slot, LCSW 10/10/2023

## 2023-10-24 ENCOUNTER — Ambulatory Visit: Admitting: Licensed Clinical Social Worker

## 2023-10-24 DIAGNOSIS — F3341 Major depressive disorder, recurrent, in partial remission: Secondary | ICD-10-CM | POA: Diagnosis not present

## 2023-10-24 DIAGNOSIS — F411 Generalized anxiety disorder: Secondary | ICD-10-CM | POA: Diagnosis not present

## 2023-10-24 NOTE — Progress Notes (Signed)
 THERAPIST PROGRESS NOTE  Session Time: 8:02am-9:02am  Participation Level: Active  Behavioral Response: CasualAlertEuthymic  Type of Therapy: Individual Therapy  Treatment Goals addressed:  Active     Anxiety     LTG: [name] will score less than 5 on the Generalized Anxiety Disorder 7 Scale (GAD-7)  (Progressing)     Start:  03/02/23    Expected End:  01/23/24       Goal Note     06/29/23: 25% progress; It's not everyday. Im constantly nervous everyday but its not as bad. Identifies a need to talk about her anxiety in session more.         STG: [name] will practice problem solving skills 3 times per week for the next 4 weeks.  (Progressing)     Start:  03/02/23    Expected End:  01/23/24       Goal Note     06/29/23: 40% progress; Before I'd say I just avoided and now I can communicate.          Work with patient individually to identify the major components of a recent episode of anxiety: physical symptoms, major thoughts and images, and major behaviors they experienced     Start:  03/02/23         Perform motivational interviewing regarding use of tools     Start:  03/02/23           OP Depression     LTG: Reduce frequency, intensity, and duration of depression symptoms so that daily functioning is improved (Progressing)     Start:  03/02/23    Expected End:  01/23/24       Goal Note     06/29/23: 28% progress; I am just sad sometimes but I don't say anything or address it. Need to address avoidance.          STG: [name] will identify cognitive patterns and beliefs that support depression (Progressing)     Start:  03/02/23    Expected End:  01/23/24       Goal Note     06/29/23: 15% progress I still have them but their are a lot of things around me that make me depressed like my job.          STG: [name] will practice behavioral activation skills 2 times per week for the next 8 weeks (Progressing)     Start:  03/02/23    Expected End:   01/23/24       Goal Note     06/29/23: 30% progress; Coping skills is talking, setting boundaries, reading, and painting.          Therapist will educate patient on cognitive distortions and the rationale for treatment of depression     Start:  03/02/23         [name] will identify 3 cognitive distortions they are currently using and write reframing statements to replace them     Start:  03/02/23         [name] will review pleasant activities list and select 2 activities to practice weekly for the next 8 weeks     Start:  03/02/23           Social Interpersonal Effectiveness     LTG: [name] will attend and participate in therapeutic, recreational and educational activities that support interpersonal effectiveness   (Progressing)     Start:  03/02/23    Expected End:  01/23/24  LTG: [name] will recognize socially inappropriate behaviors and develop alternative behaviors (Progressing)     Start:  03/02/23    Expected End:  01/23/24       Goal Note     06/29/23: 5% progress; addressing my emotions and talking about them. Need to address communication.          STG: [name] will identify 2 behaviors that engage in social isolation  (Progressing)     Start:  03/02/23    Expected End:  01/23/24       Goal Note     06/29/23: 2% progress; I think it's good to be by yourself but sometimes I take it to the extreme by going a few days without interacting with people unless I have to.          Encourage and recognize when Clema displays appropriate boundaries and behaviors     Start:  03/02/23         Work with [name] to identify 2 signs that they are engaging in social isolation     Start:  03/02/23            ProgressTowards Goals: Progressing  Interventions: CBT, Supportive, and Reframing, ACT  Summary: Michelle Richardson is a 23 y.o. female who presents with symptoms of anxiety and depression including but not limited to uncontrollable worry, anxious feelings,  racing thoughts, difficulty concentrating, restlessness, low mood. Pt was oriented times 5. Pt was cooperative and engaged. Pt denies SI/HI/AVH.   The patient utilized the therapeutic space to reflect on her low self-esteem and its implications for her personal relationships. The clinician and the patient explored the negative self-critique stemming from encounters with her parents, which have led her to be overly self-critical. The patient also mentioned experiencing racing thoughts, stating, I am always moving and going. She reported that she grew up with anxious parents and often feels on edge as a result. Additionally, she cited symptoms of social anxiety, explaining that she frequently overthinks social interactions and has to talk herself into going into stores.  Clinician encouraged patient to complete an activity reflecting on self perception and quality she wishes she could change about herself.  Then clinician challenged patient to identify positive qualities about herself to which she expressed great difficulty.  Through conversation, patient was able to construct a list of positive qualities, which she plans to strengthen throughout her therapy.  The clinician educated the patient on cognitive diffusion techniques through Acceptance and Commitment Therapy (ACT). Discussed ways for the patient to externalize her negative thought patterns and how she can challenge these thoughts. The clinician also introduced the patient to a thought dumping exercise, which she agreed to practice regularly after work and before going to sleep. The patient reflected on how her life could be different if she could relieve herself of her overthinking tendencies. The clinician and the patient continue to explore strategies to encourage a positive self-perception moving forward.  Suicidal/Homicidal: Nowithout intent/plan  Therapist Response: Clinician utilized active and supportive reflection to create a safe  space for patient to process recent life events. Clinician assessed for current symptoms, stressors, safety since last session.  Worked with patient through cognitive defusion techniques to improve self-esteem.  Reflected on the root of her anxious tendencies and behaviors learned from her caregivers.  Plan: Return again in 2 weeks.  Diagnosis: Recurrent major depressive disorder, in partial remission (HCC)  Generalized anxiety disorder   Collaboration of Care: AEB psychiatrist can access notes and cln.  Will review psychiatrists' notes. Check in with the patient and will see LCSW per availability. Patient agreed with treatment recommendations.   Patient/Guardian was advised Release of Information must be obtained prior to any record release in order to collaborate their care with an outside provider. Patient/Guardian was advised if they have not already done so to contact the registration department to sign all necessary forms in order for us  to release information regarding their care.   Consent: Patient/Guardian gives verbal consent for treatment and assignment of benefits for services provided during this visit. Patient/Guardian expressed understanding and agreed to proceed.   [name], LCSW 10/24/2023

## 2023-10-30 ENCOUNTER — Other Ambulatory Visit: Payer: Self-pay | Admitting: Family Medicine

## 2023-10-30 DIAGNOSIS — Z30011 Encounter for initial prescription of contraceptive pills: Secondary | ICD-10-CM

## 2023-10-31 ENCOUNTER — Ambulatory Visit

## 2023-10-31 ENCOUNTER — Other Ambulatory Visit: Payer: Self-pay

## 2023-10-31 DIAGNOSIS — Z30011 Encounter for initial prescription of contraceptive pills: Secondary | ICD-10-CM

## 2023-10-31 MED ORDER — MEDROXYPROGESTERONE ACETATE 150 MG/ML IM SUSP: 150.0000 mg | INTRAMUSCULAR | 0 refills | Status: DC

## 2023-11-01 ENCOUNTER — Ambulatory Visit: Admitting: Family Medicine

## 2023-11-01 ENCOUNTER — Encounter: Payer: Self-pay | Admitting: Family Medicine

## 2023-11-01 DIAGNOSIS — F411 Generalized anxiety disorder: Secondary | ICD-10-CM

## 2023-11-01 DIAGNOSIS — Z3042 Encounter for surveillance of injectable contraceptive: Secondary | ICD-10-CM

## 2023-11-01 DIAGNOSIS — F331 Major depressive disorder, recurrent, moderate: Secondary | ICD-10-CM

## 2023-11-01 DIAGNOSIS — D500 Iron deficiency anemia secondary to blood loss (chronic): Secondary | ICD-10-CM

## 2023-11-01 DIAGNOSIS — R7303 Prediabetes: Secondary | ICD-10-CM | POA: Diagnosis not present

## 2023-11-01 DIAGNOSIS — J4540 Moderate persistent asthma, uncomplicated: Secondary | ICD-10-CM

## 2023-11-01 DIAGNOSIS — E559 Vitamin D deficiency, unspecified: Secondary | ICD-10-CM

## 2023-11-01 MED ORDER — MEDROXYPROGESTERONE ACETATE 150 MG/ML IM SUSY
150.0000 mg | PREFILLED_SYRINGE | Freq: Once | INTRAMUSCULAR | Status: AC
  Administered 2023-11-01: 150 mg via INTRAMUSCULAR

## 2023-11-01 NOTE — Progress Notes (Signed)
 Name: Michelle Richardson   MRN: [id number]    DOB: 03/21/2001   Date:11/01/2023       Progress Note  Subjective  Chief Complaint  Chief Complaint  Patient presents with   Medical Management of Chronic Issues    Following up on depo ok with it but having more slight bleeding, irregular cycles never knows when its coming   Discussed the use of AI scribe software for clinical note transcription with the patient, who gave verbal consent to proceed.  History of Present Illness Michelle Richardson is a 23 year old female who presents for a follow-up visit to evaluate the effectiveness of her Depo-Provera  treatment.  She experiences irregular bleeding patterns, describing it as 'mcneil marshal,' with periods lasting two days, stopping, and then resuming. This is her third shot of Depo-Provera , and the bleeding has become lighter and less frequent over time.  She is managing depression and is currently seeing a psychiatrist and therapist. She feels 'better controlled and in charge' with her depression, although recent stressful events have impacted her mood. She is on duloxetine  60 mg and 20 mg, and prazosin  as needed. She also takes propranolol  for anxiety and is working on taking it twice daily.  She has a history of iron deficiency anemia, attributed to heavy menstrual cycles. She is taking iron supplements, which she gets from her mother, and her bleeding has reduced since starting Depo-Provera . Her last iron levels were low, with an iron level of 27 and ferritin of 8.  She has moderate persistent asthma and uses an inhaler as needed, approximately two to three times a week. No significant wheezing, coughing, or shortness of breath.  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 She reports sleeping only 3-5 hours per night and sometimes forgets to eat due to exhaustion. She consumes energy drinks like Monster and sometimes replaces meals with sugary drinks.  She  has a family history of diabetes but denies symptoms such as excessive hunger, thirst, or frequent urination. She is trying to drink more water to improve her health.    Patient Active Problem List   Diagnosis Date Noted   Recurrent major depressive disorder, in partial remission (HCC) 03/29/2023   Prediabetes 02/15/2023   Iron deficiency anemia due to chronic blood loss 02/15/2023   Insomnia 12/21/2022   Recurrent major depressive disorder, in full remission (HCC) 12/21/2022   Attention and concentration deficit 03/31/2022   Morbid obesity (HCC) 01/05/2022   Vitamin D  deficiency 01/05/2022   Asthma, mild intermittent, poorly controlled 01/05/2022   Generalized anxiety disorder 04/16/2019   Moderate episode of recurrent major depressive disorder (HCC) 04/16/2019    Past Surgical History:  Procedure Laterality Date   TONSILLECTOMY AND ADENOIDECTOMY  08/02/2008    Family History  Problem Relation Age of Onset   Hypertension Mother    Drug abuse Father    ADD / ADHD Sister    Autism spectrum disorder Sister    Obesity Sister    Cancer Maternal Grandmother    Diabetes Maternal Grandmother     Social History   Tobacco Use   Smoking status: Never   Smokeless tobacco: Never  Substance Use Topics   Alcohol use: Never     Current Outpatient Medications:    busPIRone  (BUSPAR ) 5 MG tablet, TAKE 1 TABLET BY MOUTH TWICE A DAY, Disp: 180 tablet, Rfl: 0   DULoxetine  (CYMBALTA ) 20 MG capsule, Take 1 capsule (20 mg total) by mouth  daily. Take along with 60 mg daily - total of 80 mg daily, Disp: 90 capsule, Rfl: 0   DULoxetine  (CYMBALTA ) 60 MG capsule, Take 1 capsule (60 mg total) by mouth daily. Take along with 20 mg daily, Disp: 90 capsule, Rfl: 0   fluticasone-salmeterol (WIXELA INHUB) 250-50 MCG/ACT AEPB, INHALE 1 PUFF INTO THE LUNGS IN THE MORNING AND AT BEDTIME., Disp: 180 each, Rfl: 3   medroxyPROGESTERone  (DEPO-PROVERA ) 150 MG/ML injection, Inject 1 mL (150 mg total) into the  muscle every 3 (three) months., Disp: 1 mL, Rfl: 0   prazosin  (MINIPRESS ) 1 MG capsule, TAKE 1 CAPSULE BY MOUTH EVERYDAY AT BEDTIME, Disp: 90 capsule, Rfl: 0   propranolol  (INDERAL ) 10 MG tablet, Take 1 tablet (10 mg total) by mouth 2 (two) times daily as needed., Disp: 180 tablet, Rfl: 0   Vitamin D , Ergocalciferol , (DRISDOL ) 1.25 MG (50000 UNIT) CAPS capsule, Take 1 capsule (50,000 Units total) by mouth every 7 (seven) days. Please take every Thursday starting 01/17/2019, Disp: 12 capsule, Rfl: 1  Current Facility-Administered Medications:    medroxyPROGESTERone  Acetate SUSY 150 mg, 150 mg, Intramuscular, Q90 days, Bernardo Fend, DO, 150 mg at 08/09/23 1147  No Known Allergies  I personally reviewed active problem list, medication list, allergies with the patient/caregiver today.   ROS  Ten systems reviewed and is negative except as mentioned in HPI    Objective Physical Exam  CONSTITUTIONAL: Patient appears well-developed and well-nourished.  No distress. HEENT: Head atraumatic, normocephalic, neck supple. CARDIOVASCULAR: Normal rate, regular rhythm and normal heart sounds.  No murmur heard. No BLE edema. PULMONARY: Effort normal and breath sounds normal. No respiratory distress. ABDOMINAL: There is no tenderness or distention. MUSCULOSKELETAL: Normal gait. Without gross motor or sensory deficit. PSYCHIATRIC: Patient has a normal mood and affect. behavior is normal. Judgment and thought content normal.  Vitals:   11/01/23 1031  BP: 126/76  Pulse: 86  Resp: 16  SpO2: 98%  Weight: 289 lb 8 oz (131.3 kg)  Height: 5' 9.5 (1.765 m)    Body mass index is 42.14 kg/m.   PHQ2/9:    11/01/2023   10:30 AM 08/23/2023    3:13 PM 05/24/2023   11:24 AM 03/29/2023    5:02 PM 02/23/2023    2:04 PM  Depression screen PHQ 2/9  Decreased Interest 1  0    Down, Depressed, Hopeless 1  0    PHQ - 2 Score 2  0    Altered sleeping 1  0    Tired, decreased energy 1  0    Change in  appetite 1  0    Feeling bad or failure about yourself  1  0    Trouble concentrating 1  0    Moving slowly or fidgety/restless 0  0    Suicidal thoughts 0  0    PHQ-9 Score 7  0    Difficult doing work/chores Very difficult  Not difficult at all       Information is confidential and restricted. Go to Review Flowsheets to unlock data.    phq 9 is positive  Fall Risk:    11/01/2023   10:26 AM 05/24/2023   11:20 AM 02/15/2023   10:56 AM 08/05/2022   10:55 AM 05/05/2022    9:31 AM  Fall Risk   Falls in the past year? 0 0 0 0 0  Number falls in past yr: 0 0   0  Injury with Fall? 0 0   0  Risk for fall due to : No Fall Risks No Fall Risks No Fall Risks No Fall Risks No Fall Risks  Follow up Falls evaluation completed Falls prevention discussed;Education provided;Falls evaluation completed Falls prevention discussed;Education provided;Falls evaluation completed Falls prevention discussed Falls prevention discussed;Education provided;Falls evaluation completed      Data saved with a previous flowsheet row definition      Assessment & Plan Contraceptive use (depot medroxyprogesterone ) with irregular bleeding Irregular bleeding is a common side effect of depot medroxyprogesterone , expected to decrease with continued use. - Advise continued use of panty liners for spotting. - Reassure bleeding likely to decrease with continued use.  Iron deficiency anemia secondary to menstrual blood loss Iron deficiency anemia due to menstrual blood loss with iron at 27 and ferritin at 8. She is taking iron supplements. - Continue iron supplements. - Reassess iron levels at next visit if symptoms persist.  Major depressive disorder, recurrent, in partial remission Depression in partial remission with current treatment. Improved control and boundary setting. Sleep disturbances noted, potentially affecting mood and memory. - Continue duloxetine  20 mg and 60 mg daily. - Continue prazosin  1 mg at  night. - Encourage routine sleep schedule and taking prazosin  before bed. - Encourage propranolol  10 mg twice a day for anxiety. - Continue therapy sessions with psychiatrist and therapist.  Moderate persistent asthma Asthma is moderate persistent. Inhaler used as needed, 2-3 times a week, with no significant wheezing or shortness of breath. - Continue as-needed use of inhaler. - Monitor symptoms and adjust inhaler use as necessary.  Obesity (BMI 42.1) Obesity with BMI of 42.1. Weight loss noted since last visit. Sleep and dietary habits may impact weight management. - Encourage improved sleep hygiene. - Discuss reducing sugary beverages and replacing with water or low-calorie alternatives.  Prediabetes Prediabetes with no current symptoms of diabetes. Family history of diabetes noted. - Monitor for symptoms of diabetes. - Encourage healthy lifestyle modifications to prevent progression.  Vitamin D  deficiency Vitamin D  deficiency noted. Inconsistent supplement intake. - Advise taking 2000 IU of vitamin D  daily, preferably in capsule form.

## 2023-11-09 ENCOUNTER — Ambulatory Visit: Admitting: Licensed Clinical Social Worker

## 2023-11-09 DIAGNOSIS — F3341 Major depressive disorder, recurrent, in partial remission: Secondary | ICD-10-CM | POA: Diagnosis not present

## 2023-11-09 DIAGNOSIS — F411 Generalized anxiety disorder: Secondary | ICD-10-CM

## 2023-11-09 NOTE — Progress Notes (Signed)
 THERAPIST PROGRESS NOTE  Session Time: 8:03am-9am  Participation Level: Active  Behavioral Response: CasualAlertAnxious and Euthymic  Type of Therapy: Individual Therapy  Treatment Goals addressed:  Active     Anxiety     LTG: Lenah will score less than 5 on the Generalized Anxiety Disorder 7 Scale (GAD-7)  (Progressing)     Start:  03/02/23    Expected End:  01/23/24       Goal Note     06/29/23: 25% progress; It's not everyday. Im constantly nervous everyday but its not as bad. Identifies a need to talk about her anxiety in session more.         STG: Marrian will practice problem solving skills 3 times per week for the next 4 weeks.  (Progressing)     Start:  03/02/23    Expected End:  01/23/24       Goal Note     06/29/23: 40% progress; Before I'd say I just avoided and now I can communicate.          Work with patient individually to identify the major components of a recent episode of anxiety: physical symptoms, major thoughts and images, and major behaviors they experienced     Start:  03/02/23         Perform motivational interviewing regarding use of tools     Start:  03/02/23           OP Depression     LTG: Reduce frequency, intensity, and duration of depression symptoms so that daily functioning is improved (Progressing)     Start:  03/02/23    Expected End:  01/23/24       Goal Note     06/29/23: 28% progress; I am just sad sometimes but I don't say anything or address it. Need to address avoidance.          STG: Athalee will identify cognitive patterns and beliefs that support depression (Progressing)     Start:  03/02/23    Expected End:  01/23/24       Goal Note     06/29/23: 15% progress I still have them but their are a lot of things around me that make me depressed like my job.          STG: Jupiter will practice behavioral activation skills 2 times per week for the next 8 weeks (Progressing)     Start:  03/02/23    Expected  End:  01/23/24       Goal Note     06/29/23: 30% progress; Coping skills is talking, setting boundaries, reading, and painting.          Therapist will educate patient on cognitive distortions and the rationale for treatment of depression     Start:  03/02/23         Chrishawna will identify 3 cognitive distortions they are currently using and write reframing statements to replace them     Start:  03/02/23         Arie will review pleasant activities list and select 2 activities to practice weekly for the next 8 weeks     Start:  03/02/23           Social Interpersonal Effectiveness     LTG: Jaselle will attend and participate in therapeutic, recreational and educational activities that support interpersonal effectiveness   (Progressing)     Start:  03/02/23    Expected End:  01/23/24  LTG: Araya will recognize socially inappropriate behaviors and develop alternative behaviors (Progressing)     Start:  03/02/23    Expected End:  01/23/24       Goal Note     06/29/23: 5% progress; addressing my emotions and talking about them. Need to address communication.          STG: Makaylin will identify 2 behaviors that engage in social isolation  (Progressing)     Start:  03/02/23    Expected End:  01/23/24       Goal Note     06/29/23: 2% progress; I think it's good to be by yourself but sometimes I take it to the extreme by going a few days without interacting with people unless I have to.          Encourage and recognize when Breasia displays appropriate boundaries and behaviors     Start:  03/02/23         Work with [name] to identify 2 signs that they are engaging in social isolation     Start:  03/02/23            ProgressTowards Goals: Progressing  Interventions: CBT, Assertiveness Training, Supportive, and Reframing  Summary: Bea Dewilde is a 23 y.o. female who presents with symptoms of anxiety and depression including but not limited to uncontrollable  worry, anxious feelings, racing thoughts, difficulty concentrating, restlessness, low mood. Pt was oriented times 5. Pt was cooperative and engaged. Pt denies SI/HI/AVH.   The patient presented to today's session feeling anxious, as indicated by her difficulty making eye contact, use of self-deprecating humor, and restlessness. She continues to express negative feelings about herself, experiences isolation, struggles to express her emotions, and has difficulty establishing healthy boundaries.  Through the exercises in session, the patient identified a goal to pause before she engages in avoidance related to important feelings, reminding herself that it is safe to experience her emotions. She also expressed a desire to continue developing her assertive communication skills and to challenge herself to accept and express emotional vulnerability. The patient identified several triggers associated with her patterns of avoidance, including feelings related to unhealthy treatment within her home dynamics that lead her to feel, I am always in the wrong. She reflected on her tendency to seek validation from others. Role-playing exercises were conducted to practice assertive communication, express her feelings, identify triggers, and communicate her needs to her support system.  Suicidal/Homicidal: Nowithout intent/plan  Therapist Response: Clinician utilized active and supportive reflection to create a safe space for patient to process recent life events. Clinician assessed for current symptoms, stressors, safety since last session.    During the session, the clinician helped the patient process her feelings related to unhealthy patterns of avoidance and feelings of regret. A supportive environment was created for the patient to explore her discomfort with sitting and her emotions through an exercise focused on self-reflection of behaviors in a previous relationship. The therapist utilized the CBT triangle to  help the patient identify patterns in her behavior and thoughts. Socratic questioning was applied to explore her feelings of invalidation and to engage more deeply with her past experiences. The therapist assisted the patient in understanding how her feelings influence her behavior and discussed behavioral patterns that may be contributing to her self-deprecating tendencies.  The patient continues to struggle with low self-esteem, making it difficult for her to engage in desired relationships. While she shows improvements in acknowledging unhealthy patterns, she still finds it challenging  to recall these behaviors in the moment. Extensive Socratic questioning is required to help her gain deeper insight into her feelings and behavioral patterns.  Plan: Return again in 2 weeks.  Diagnosis: Recurrent major depressive disorder, in partial remission (HCC)  Generalized anxiety disorder   Collaboration of Care: AEB psychiatrist can access notes and cln. Will review psychiatrists' notes. Check in with the patient and will see LCSW per availability. Patient agreed with treatment recommendations.   Patient/Guardian was advised Release of Information must be obtained prior to any record release in order to collaborate their care with an outside provider. Patient/Guardian was advised if they have not already done so to contact the registration department to sign all necessary forms in order for us  to release information regarding their care.   Consent: Patient/Guardian gives verbal consent for treatment and assignment of benefits for services provided during this visit. Patient/Guardian expressed understanding and agreed to proceed.   [name], LCSW 11/09/2023

## 2023-11-20 ENCOUNTER — Ambulatory Visit: Admitting: Psychiatry

## 2023-11-20 ENCOUNTER — Encounter: Payer: Self-pay | Admitting: Psychiatry

## 2023-11-20 VITALS — BP 128/88 | HR 99 | Temp 98.3°F | Ht 69.5 in | Wt 290.0 lb

## 2023-11-20 DIAGNOSIS — F411 Generalized anxiety disorder: Secondary | ICD-10-CM

## 2023-11-20 DIAGNOSIS — F3341 Major depressive disorder, recurrent, in partial remission: Secondary | ICD-10-CM

## 2023-11-20 DIAGNOSIS — F5101 Primary insomnia: Secondary | ICD-10-CM

## 2023-11-20 MED ORDER — DULOXETINE HCL 60 MG PO CPEP: 60.0000 mg | ORAL_CAPSULE | Freq: Every day | ORAL | 0 refills | Status: DC

## 2023-11-20 MED ORDER — DULOXETINE HCL 20 MG PO CPEP
20.0000 mg | ORAL_CAPSULE | Freq: Every day | ORAL | 0 refills | Status: DC
Start: 2023-11-20 — End: 2024-03-11

## 2023-11-20 MED ORDER — BUSPIRONE HCL 5 MG PO TABS: 5.0000 mg | ORAL_TABLET | Freq: Two times a day (BID) | ORAL | 0 refills | Status: DC

## 2023-11-20 NOTE — Progress Notes (Signed)
 [facility] MD OP Progress Note  11/20/2023 9:36 AM Michelle Richardson  MRN:  [id number]  Chief Complaint:  Chief Complaint  Patient presents with   Follow-up   Depression   Anxiety   Medication Refill   Discussed the use of AI scribe software for clinical note transcription with the patient, who gave verbal consent to proceed.  History of Present Illness Michelle Richardson is a 23 year old [place] American female, currently employed, lives in [place] with her mother, has a history of MDD, GAD, insomnia was evaluated in office today for a follow-up appointment.  She experiences significant sleep disturbances, often waking up multiple times during the night, with the first awakening as early as 3 AM. Some of her sleep interruptions are attributed to her cat, who recently got spayed and is restless. She typically gets about five hours of sleep in total, often in fragmented segments, and works night shifts from 5 PM to 5 AM, which contributes to her irregular sleep pattern. She takes Minipress  at bedtime to aid sleep, which she finds helpful.  Her work schedule is demanding, often involving double shifts from 4 PM to 5 AM with an hour break. During her break, she eats quickly and takes a nap. The increased workload is due to the closure of a nearby location, leading to more work being distributed to her current location. She mentions that she can refuse double shifts but finds it easier to accept them to manage her workload better.  She is currently taking Cymbalta  (duloxetine ) 60 mg plus 20 mg, totaling 80 mg, and propranolol  as needed. She wants to reduce her medication intake. She is also on Buspar  but does not wish to increase the dose, as she is finding other coping mechanisms for her anxiety.  Her eating habits are irregular, often forgetting to eat due to her busy schedule. She tends to eat late at night, which she acknowledges is not ideal. She sometimes resorts to eating [place] sticks that have been stored  for an unknown duration, which makes her feel unwell. She also consumes [employer], which she recognizes as an issue due to its high caffeine and sugar content.  She is considering returning to school and has planned a tour of [employer]. She is contemplating leaving her current job at [employer] if she decides to pursue her education, as the nearest [employer] location to the school is an hour away. [place] and is open to finding a new job if she likes the school.  She mentions a recent breakup, which she is working through emotionally. She is trying to adjust to life changes and is focusing on self-improvement and emotional regulation.  Denies any suicidality, homicidality or perceptual disturbances.  Visit Diagnosis:    ICD-10-CM   1. Recurrent major depressive disorder, in partial remission (HCC)  F33.41 DULoxetine  (CYMBALTA ) 20 MG capsule    DULoxetine  (CYMBALTA ) 60 MG capsule    2. Generalized anxiety disorder  F41.1 busPIRone  (BUSPAR ) 5 MG tablet    DULoxetine  (CYMBALTA ) 20 MG capsule    3. Primary insomnia  F51.01       Past Psychiatric History: I have reviewed past psychiatric history from progress note on 03/30/2022.  ADHD testing completed by Dr. Ethelene 01/12/2023-does not meet criteria for ADHD.  May benefit from sleep apnea testing.  Past Medical History:  Past Medical History:  Diagnosis Date   Anemia    Anxiety    Asthma  Depression    Pediatric obesity    Suicide ideation 01/11/2019    Past Surgical History:  Procedure Laterality Date   TONSILLECTOMY AND ADENOIDECTOMY  08/02/2008    Family Psychiatric History: I have reviewed family psychiatric history from progress note on 03/30/2022.  Family History:  Family History  Problem Relation Age of Onset   Hypertension Mother    Drug abuse Father    ADD / ADHD Sister    Autism spectrum disorder Sister    Obesity Sister    Cancer Maternal Grandmother     Diabetes Maternal Grandmother     Social History: I have reviewed social history from progress note on 03/30/2022. Social History   Socioeconomic History   Marital status: Single    Spouse name: Not on file   Number of children: 0   Years of education: Not on file   Highest education level: 12th grade  Occupational History   Not on file  Tobacco Use   Smoking status: Never   Smokeless tobacco: Never  Vaping Use   Vaping status: Never Used  Substance and Sexual Activity   Alcohol use: Never   Drug use: Never   Sexual activity: Not Currently    Birth control/protection: Abstinence  Other Topics Concern   Not on file  Social History Narrative   Not on file   Social Drivers of Health   Financial Resource Strain: Low Risk  (05/23/2023)   Overall Financial Resource Strain (CARDIA)    Difficulty of Paying Living Expenses: Not very hard  Food Insecurity: No Food Insecurity (05/23/2023)   Hunger Vital Sign    Worried About Running Out of Food in the Last Year: Never true    Ran Out of Food in the Last Year: Never true  Transportation Needs: No Transportation Needs (05/23/2023)   PRAPARE - Administrator, Civil Service (Medical): No    Lack of Transportation (Non-Medical): No  Physical Activity: Sufficiently Active (05/23/2023)   Exercise Vital Sign    Days of Exercise per Week: 3 days    Minutes of Exercise per Session: 70 min  Stress: Stress Concern Present (05/23/2023)   [employer] of Occupational Health - Occupational Stress Questionnaire    Feeling of Stress : To some extent  Social Connections: Moderately Isolated (05/23/2023)   Social Connection and Isolation Panel    Frequency of Communication with Friends and Family: More than three times a week    Frequency of Social Gatherings with Friends and Family: Once a week    Attends Religious Services: 1 to 4 times per year    Active Member of [employer] or Organizations: No    Attends Banker  Meetings: Never    Marital Status: Never married    Allergies: No Known Allergies  Metabolic Disorder Labs: Lab Results  Component Value Date   HGBA1C 6.0 (H) 02/15/2023   MPG 126 02/15/2023   MPG 120 01/06/2022   Lab Results  Component Value Date   PROLACTIN 8.3 02/15/2023   PROLACTIN 11.1 01/06/2022   Lab Results  Component Value Date   CHOL 144 02/15/2023   TRIG 74 02/15/2023   HDL 60 02/15/2023   CHOLHDL 2.4 02/15/2023   VLDL 12 01/11/2019   LDLCALC 69 02/15/2023   LDLCALC 80 01/06/2022   Lab Results  Component Value Date   TSH 0.73 01/06/2022   TSH 0.863 01/11/2019    Therapeutic Level Labs: No results found for: LITHIUM No results found for: VALPROATE  No results found for: CBMZ  Current Medications: Current Outpatient Medications  Medication Sig Dispense Refill   Cholecalciferol (VITAMIN D ) 50 MCG (2000 UT) CAPS Take 1 capsule by mouth daily.     fluticasone-salmeterol (WIXELA INHUB) 250-50 MCG/ACT AEPB INHALE 1 PUFF INTO THE LUNGS IN THE MORNING AND AT BEDTIME. 180 each 3   medroxyPROGESTERone  (DEPO-PROVERA ) 150 MG/ML injection Inject 1 mL (150 mg total) into the muscle every 3 (three) months. 1 mL 0   prazosin  (MINIPRESS ) 1 MG capsule TAKE 1 CAPSULE BY MOUTH EVERYDAY AT BEDTIME 90 capsule 0   propranolol  (INDERAL ) 10 MG tablet Take 1 tablet (10 mg total) by mouth 2 (two) times daily as needed. 180 tablet 0   busPIRone  (BUSPAR ) 5 MG tablet Take 1 tablet (5 mg total) by mouth 2 (two) times daily. 180 tablet 0   DULoxetine  (CYMBALTA ) 20 MG capsule Take 1 capsule (20 mg total) by mouth daily. Take along with 60 mg daily - total of 80 mg daily 90 capsule 0   DULoxetine  (CYMBALTA ) 60 MG capsule Take 1 capsule (60 mg total) by mouth daily. Take along with 20 mg daily 90 capsule 0   Current Facility-Administered Medications  Medication Dose Route Frequency Provider Last Rate Last Admin   medroxyPROGESTERone  Acetate SUSY 150 mg  150 mg Intramuscular Q90 days  Bernardo Fend, DO   150 mg at 08/09/23 1147     Musculoskeletal: Strength & Muscle Tone: within normal limits Gait & Station: normal Patient leans: N/A  Psychiatric Specialty Exam: Review of Systems  Psychiatric/Behavioral:  Positive for sleep disturbance. The patient is nervous/anxious.     Blood pressure 128/88, pulse 99, temperature 98.3 F (36.8 C), temperature source Temporal, height 5' 9.5 (1.765 m), weight 290 lb (131.5 kg), SpO2 99%.Body mass index is 42.21 kg/m.  General Appearance: Casual  Eye Contact:  Fair  Speech:  Clear and Coherent  Volume:  Normal  Mood:  Anxious  Affect:  Full Range  Thought Process:  Goal Directed and Descriptions of Associations: Intact  Orientation:  Full (Time, Place, and Person)  Thought Content: Logical   Suicidal Thoughts:  No  Homicidal Thoughts:  No  Memory:  Immediate;   Fair Recent;   Fair Remote;   Fair  Judgement:  Fair  Insight:  Fair  Psychomotor Activity:  Normal  Concentration:  Concentration: Fair and Attention Span: Fair  Recall:  [employer] of Knowledge: Fair  Language: Fair  Akathisia:  No  Handed:  Right  AIMS (if indicated): not done  Assets:  Communication Skills Desire for Improvement Housing Social Support Transportation  ADL's:  Intact  Cognition: WNL  Sleep:  Poor   Screenings: AIMS    Flowsheet Row [facility]  AIMS Total Score 0   GAD-7    Flowsheet Row Office Visit from 11/20/2023 in [facility] Office Visit from 11/01/2023 in [facility] Office Visit from 08/23/2023 in [facility] Office Visit from 05/24/2023 in [facility] Office Visit from 03/29/2023 in [facility]  Total GAD-7 Score 10 21 10 7 13    PHQ2-9    Flowsheet Row Office Visit  from 11/20/2023 in [facility] Office Visit from 11/01/2023 in [facility] Office Visit from 08/23/2023 in [facility] Office Visit from 05/24/2023 in [facility] Office Visit from 03/29/2023 in [facility]  PHQ-2 Total Score 2 2 4  0 4  PHQ-9 Total Score 9 7 15  0 13   Flowsheet Row Office Visit from 11/20/2023 in [facility] Office Visit from 08/23/2023 in [facility] Office Visit from 06/21/2023 in [facility]  C-SSRS RISK CATEGORY Moderate Risk No Risk Moderate Risk     Assessment and Plan: Kailynne Ferrington is a 23 year old African-American female who has a history of MDD, GAD, insomnia was evaluated in office today for a follow-up appointment.  Discussed assessment and plan as noted below.  Depression in remission Currently denies any significant depression symptoms although there are times that she cannot feel her emotions and is currently working on that with her therapist.  Does have problems with appetite and sleep mostly because of lacking set schedule. Continue Duloxetine  60 mg and 20 mg daily total of 80 mg daily Continue BuSpar  5 mg twice daily Continue psychotherapy sessions with Ms. Evalene Husband.  Generalized anxiety disorder-improving Currently does have anxiety mostly related to situational stressors.  Working on managing the same using grounding and coping strategies. Continue Duloxetine  and BuSpar  as prescribed Continue Propranolol  10 mg twice a day as needed Encouraged compliance with medications. Continue psychotherapy sessions with Ms. Evalene Husband  Insomnia-unstable Current sleep problems are due to working double shifts sometimes as well as lacking sleep hygiene.  Currently more  compliant with Minipress .  Not interested in trial of another sleep aid at this time. Encouraged to work on sleep hygiene Continue Prazosin  1 mg at bedtime  Follow-up Follow-up in clinic in 3 months or sooner if needed.   Collaboration of Care: Collaboration of Care: Referral or follow-up with counselor/therapist AEB encouraged to continue follow-up with therapist, have reviewed notes per Ms. Perkins dated 11/09/2023.  Patient/Guardian was advised Release of Information must be obtained prior to any record release in order to collaborate their care with an outside provider. Patient/Guardian was advised if they have not already done so to contact the registration department to sign all necessary forms in order for us  to release information regarding their care.   Consent: Patient/Guardian gives verbal consent for treatment and assignment of benefits for services provided during this visit. Patient/Guardian expressed understanding and agreed to proceed.   This note was generated in part or whole with voice recognition software. Voice recognition is usually quite accurate but there are transcription errors that can and very often do occur. I apologize for any typographical errors that were not detected and corrected.    Shalie Schremp, MD 11/20/2023, 9:36 AM

## 2023-11-21 ENCOUNTER — Ambulatory Visit: Admitting: Licensed Clinical Social Worker

## 2023-11-21 DIAGNOSIS — F3341 Major depressive disorder, recurrent, in partial remission: Secondary | ICD-10-CM

## 2023-11-21 DIAGNOSIS — F411 Generalized anxiety disorder: Secondary | ICD-10-CM

## 2023-11-21 NOTE — Progress Notes (Signed)
 THERAPIST PROGRESS NOTE  Session Time: 8:06am-8:59am  Participation Level: Active  Behavioral Response: CasualAlertAnxious  Type of Therapy: Individual Therapy  Treatment Goals addressed:  Active     Anxiety     LTG: Michelle Richardson will score less than 5 on the Generalized Anxiety Disorder 7 Scale (GAD-7)  (Progressing)     Start:  03/02/23    Expected End:  01/23/24       Goal Note     06/29/23: 25% progress; It's not everyday. Im constantly nervous everyday but its not as bad. Identifies a need to talk about her anxiety in session more.         STG: Michelle Richardson will practice problem solving skills 3 times per week for the next 4 weeks.  (Progressing)     Start:  03/02/23    Expected End:  01/23/24       Goal Note     06/29/23: 40% progress; Before I'd say I just avoided and now I can communicate.          Work with patient individually to identify the major components of a recent episode of anxiety: physical symptoms, major thoughts and images, and major behaviors they experienced     Start:  03/02/23         Perform motivational interviewing regarding use of tools     Start:  03/02/23           OP Depression     LTG: Reduce frequency, intensity, and duration of depression symptoms so that daily functioning is improved (Progressing)     Start:  03/02/23    Expected End:  01/23/24       Goal Note     06/29/23: 28% progress; I am just sad sometimes but I don't say anything or address it. Need to address avoidance.          STG: Michelle Richardson will identify cognitive patterns and beliefs that support depression (Progressing)     Start:  03/02/23    Expected End:  01/23/24       Goal Note     06/29/23: 15% progress I still have them but their are a lot of things around me that make me depressed like my job.          STG: Michelle Richardson will practice behavioral activation skills 2 times per week for the next 8 weeks (Progressing)     Start:  03/02/23    Expected End:   01/23/24       Goal Note     06/29/23: 30% progress; Coping skills is talking, setting boundaries, reading, and painting.          Therapist will educate patient on cognitive distortions and the rationale for treatment of depression     Start:  03/02/23         Michelle Richardson will identify 3 cognitive distortions they are currently using and write reframing statements to replace them     Start:  03/02/23         Michelle Richardson will review pleasant activities list and select 2 activities to practice weekly for the next 8 weeks     Start:  03/02/23           Social Interpersonal Effectiveness     LTG: Michelle Richardson will attend and participate in therapeutic, recreational and educational activities that support interpersonal effectiveness   (Progressing)     Start:  03/02/23    Expected End:  01/23/24  LTG: Michelle Richardson will recognize socially inappropriate behaviors and develop alternative behaviors (Progressing)     Start:  03/02/23    Expected End:  01/23/24       Goal Note     06/29/23: 5% progress; addressing my emotions and talking about them. Need to address communication.          STG: Michelle Richardson will identify 2 behaviors that engage in social isolation  (Progressing)     Start:  03/02/23    Expected End:  01/23/24       Goal Note     06/29/23: 2% progress; I think it's good to be by yourself but sometimes I take it to the extreme by going a few days without interacting with people unless I have to.          Encourage and recognize when Michelle Richardson displays appropriate boundaries and behaviors     Start:  03/02/23         Work with [name] to identify 2 signs that they are engaging in social isolation     Start:  03/02/23            ProgressTowards Goals: Progressing  Interventions: CBT, Assertiveness Training, Supportive, and Reframing  Summary: Michelle Richardson is a 23 y.o. female who presents with symptoms of anxiety and depression including but not limited to uncontrollable worry,  anxious feelings, racing thoughts, difficulty concentrating, restlessness, low mood. Pt was oriented times 5. Pt was cooperative and engaged. Pt denies SI/HI/AVH.   The patient utilized therapy sessions to process recent developments in her personal relationships. She explored her worst-case scenarios and fears about an upcoming conversation, as well as her struggles with presenting herself as vulnerable. The clinician provided education on healthy communication techniques, particularly focusing on active listening through parroting and using I feel... I need... statements.  The patient acknowledged that she often engages in negative thinking patterns, rehearsing future conversations out of fear of ending up in a failed relationship--similar to her parents' divorce. She expressed a generalized fear of becoming like her parents and making similar life choices. The clinician assisted the patient in reframing this perspective, helping her to find ways to avoid navigating life decisions in a constant state of fear.  For homework, the patient was asked to write a letter to herself discussing how her life could be different if she chose to step outside of her fear and make decisions independent of her current expectations.  Suicidal/Homicidal: Nowithout intent/plan  Therapist Response: Clinician utilized active and supportive reflection to create a safe space for patient to process recent life events. Clinician assessed for current symptoms, stressors, safety since last session.  Clinician educated patient on healthy communication techniques.  Utilized CBT to actively reframe patient's negative cognitions and cope with managing expectations for the future.  Plan: Return again in 2 weeks.  Diagnosis: Recurrent major depressive disorder, in partial remission (HCC)  Generalized anxiety disorder   Collaboration of Care: AEB psychiatrist can access notes and cln. Will review psychiatrists' notes. Check in  with the patient and will see LCSW per availability. Patient agreed with treatment recommendations.   Patient/Guardian was advised Release of Information must be obtained prior to any record release in order to collaborate their care with an outside provider. Patient/Guardian was advised if they have not already done so to contact the registration department to sign all necessary forms in order for us  to release information regarding their care.   Consent: Patient/Guardian gives verbal consent for treatment and  assignment of benefits for services provided during this visit. Patient/Guardian expressed understanding and agreed to proceed.   Michelle Richardson KATHEE Husband, LCSW 11/21/2023

## 2023-12-14 ENCOUNTER — Ambulatory Visit: Admitting: Licensed Clinical Social Worker

## 2023-12-14 DIAGNOSIS — F411 Generalized anxiety disorder: Secondary | ICD-10-CM

## 2023-12-14 DIAGNOSIS — F3341 Major depressive disorder, recurrent, in partial remission: Secondary | ICD-10-CM

## 2023-12-14 NOTE — Progress Notes (Signed)
 THERAPIST PROGRESS NOTE  Session Time: 8:25am-9am  Participation Level: Active  Behavioral Response: CasualAlertEuthymic  Type of Therapy: Individual Therapy  Treatment Goals addressed:  Active     Anxiety     LTG: Michelle Richardson will score less than 5 on the Generalized Anxiety Disorder 7 Scale (GAD-7)  (Progressing)     Start:  03/02/23    Expected End:  01/23/24       Goal Note     06/29/23: 25% progress; It's not everyday. Im constantly nervous everyday but its not as bad. Identifies a need to talk about her anxiety in session more.         STG: Michelle Richardson will practice problem solving skills 3 times per week for the next 4 weeks.  (Progressing)     Start:  03/02/23    Expected End:  01/23/24       Goal Note     06/29/23: 40% progress; Before I'd say I just avoided and now I can communicate.          Work with patient individually to identify the major components of a recent episode of anxiety: physical symptoms, major thoughts and images, and major behaviors they experienced     Start:  03/02/23         Perform motivational interviewing regarding use of tools     Start:  03/02/23           OP Depression     LTG: Reduce frequency, intensity, and duration of depression symptoms so that daily functioning is improved (Progressing)     Start:  03/02/23    Expected End:  01/23/24       Goal Note     06/29/23: 28% progress; I am just sad sometimes but I don't say anything or address it. Need to address avoidance.          STG: Michelle Richardson will identify cognitive patterns and beliefs that support depression (Progressing)     Start:  03/02/23    Expected End:  01/23/24       Goal Note     06/29/23: 15% progress I still have them but their are a lot of things around me that make me depressed like my job.          STG: Michelle Richardson will practice behavioral activation skills 2 times per week for the next 8 weeks (Progressing)     Start:  03/02/23    Expected End:  01/23/24        Goal Note     06/29/23: 30% progress; Coping skills is talking, setting boundaries, reading, and painting.          Therapist will educate patient on cognitive distortions and the rationale for treatment of depression     Start:  03/02/23         Michelle Richardson will identify 3 cognitive distortions they are currently using and write reframing statements to replace them     Start:  03/02/23         Michelle Richardson will review pleasant activities list and select 2 activities to practice weekly for the next 8 weeks     Start:  03/02/23           Social Interpersonal Effectiveness     LTG: Michelle Richardson will attend and participate in therapeutic, recreational and educational activities that support interpersonal effectiveness   (Progressing)     Start:  03/02/23    Expected End:  01/23/24  LTG: Michelle Richardson will recognize socially inappropriate behaviors and develop alternative behaviors (Progressing)     Start:  03/02/23    Expected End:  01/23/24       Goal Note     06/29/23: 5% progress; addressing my emotions and talking about them. Need to address communication.          STG: Michelle Richardson will identify 2 behaviors that engage in social isolation  (Progressing)     Start:  03/02/23    Expected End:  01/23/24       Goal Note     06/29/23: 2% progress; I think it's good to be by yourself but sometimes I take it to the extreme by going a few days without interacting with people unless I have to.          Encourage and recognize when Michelle Richardson displays appropriate boundaries and behaviors     Start:  03/02/23         Work with Michelle Richardson to identify 2 signs that they are engaging in social isolation     Start:  03/02/23            ProgressTowards Goals: Progressing  Interventions: CBT, Assertiveness Training, Supportive, and Reframing  Summary: Michelle Richardson is a 23 y.o. female who presents with symptoms of anxiety and depression including but not limited to uncontrollable worry, anxious  feelings, racing thoughts, difficulty concentrating, restlessness, low mood. Pt was oriented times 5. Pt was cooperative and engaged. Pt denies SI/HI/AVH.   The patient reflected on recent life experiences, identifying ways to maintain healthy relationships while also focusing on setting boundaries within unhealthy ones. She recognized new insights about the behaviors of others as she progresses towards achieving her personal goals. The patient expressed excitement about the progress she is making and the steps she is taking to begin her college education, despite receiving limited support from her immediate network. Worked to reframe negative cogntions in repsonse to the actions of others. Additionally, she noted her successful use of assertive communication and her efforts to establish clear expectations regarding the behavior she will accept in her relationships.  Suicidal/Homicidal: Nowithout intent/plan  Therapist Response: Clinician utilized active and supportive reflection to create a safe space for patient to process recent life events. Clinician assessed for current symptoms, stressors, safety since last session.  Patient demonstrates improvement with her ability to recognize inappropriate and unhealthy behavior within social interactions and actively work towards utilizing assertive communication to address boundaries moving forward.  Patient also demonstrates progress in her ability to communicate about her emotions within certain social situations which is progress from the beginning of her therapeutic process.  Plan: Return again in 2 weeks.  Diagnosis: Recurrent major depressive disorder, in partial remission (HCC)  Generalized anxiety disorder   Collaboration of Care: AEB psychiatrist can access notes and cln. Will review psychiatrists' notes. Check in with the patient and will see LCSW per availability. Patient agreed with treatment recommendations.   Patient/Guardian was advised  Release of Information must be obtained prior to any record release in order to collaborate their care with an outside provider. Patient/Guardian was advised if they have not already done so to contact the registration department to sign all necessary forms in order for us  to release information regarding their care.   Consent: Patient/Guardian gives verbal consent for treatment and assignment of benefits for services provided during this visit. Patient/Guardian expressed understanding and agreed to proceed.   Michelle KATHEE Husband, LCSW 12/14/2023

## 2023-12-16 ENCOUNTER — Other Ambulatory Visit: Payer: Self-pay | Admitting: Psychiatry

## 2023-12-16 DIAGNOSIS — F411 Generalized anxiety disorder: Secondary | ICD-10-CM

## 2023-12-27 ENCOUNTER — Ambulatory Visit: Admitting: Licensed Clinical Social Worker

## 2023-12-27 DIAGNOSIS — F3341 Major depressive disorder, recurrent, in partial remission: Secondary | ICD-10-CM

## 2023-12-27 DIAGNOSIS — F411 Generalized anxiety disorder: Secondary | ICD-10-CM | POA: Diagnosis not present

## 2023-12-27 NOTE — Progress Notes (Signed)
 THERAPIST PROGRESS NOTE  Progress Review   Session Time: 8-9:02AM  Participation Level: Active  Behavioral Response: CasualAlertEuthymic  Type of Therapy: Individual Therapy  Treatment Goals addressed:  Active     Anxiety     LTG: Michelle Richardson will score less than 5 on the Generalized Anxiety Disorder 7 Scale (GAD-7)  (Progressing)     Start:  03/02/23    Expected End:  03/27/24       Goal Note     12/27/23: Pt reports her anxiety has been worse as a result of life circumstances. Shares she has been using her coping skills.          STG: Michelle Richardson will practice problem solving skills 3 times per week for the next 4 weeks.  (Progressing)     Start:  03/02/23    Expected End:  03/27/24         Work with patient individually to identify the major components of a recent episode of anxiety: physical symptoms, major thoughts and images, and major behaviors they experienced     Start:  03/02/23         Perform motivational interviewing regarding use of tools     Start:  03/02/23           OP Depression     LTG: Reduce frequency, intensity, and duration of depression symptoms so that daily functioning is improved (Progressing)     Start:  03/02/23    Expected End:  03/27/24       Goal Note     12/27/23: Pt reports she feels her anxiety is overwhelming her depressive symptoms therefore, she feels she cannot report on her depression.          STG: Michelle Richardson will identify cognitive patterns and beliefs that support depression (Progressing)     Start:  03/02/23    Expected End:  03/27/24         STG: Michelle Richardson will practice behavioral activation skills 2 times per week for the next 8 weeks (Progressing)     Start:  03/02/23    Expected End:  03/27/24         Therapist will educate patient on cognitive distortions and the rationale for treatment of depression     Start:  03/02/23         Michelle Richardson will identify 3 cognitive distortions they are currently using and write reframing  statements to replace them     Start:  03/02/23         Michelle Richardson will review pleasant activities list and select 2 activities to practice weekly for the next 8 weeks     Start:  03/02/23           Social Interpersonal Effectiveness     LTG: Michelle Richardson will attend and participate in therapeutic, recreational and educational activities that support interpersonal effectiveness   (Not Applicable)     Start:  03/02/23    Expected End:  01/23/24    Resolved:  12/27/23      LTG: Michelle Richardson will recognize socially inappropriate behaviors and develop alternative behaviors (Progressing)     Start:  03/02/23    Expected End:  03/27/24       Goal Note     12/27/23: Pt reports improvement as she is beginning to establish boundaries with others and be more mindful of engaging in acttivities she wants to engage in.          STG: Michelle Richardson will identify 2 behaviors that  engage in social isolation  (Progressing)     Start:  03/02/23    Expected End:  03/27/24         Encourage and recognize when Michelle Richardson displays appropriate boundaries and behaviors     Start:  03/02/23         Work with Michelle Richardson to identify 2 signs that they are engaging in social isolation     Start:  03/02/23            ProgressTowards Goals: Progressing  Interventions: CBT, Strength-based, Conservator, museum/gallery, Supportive, and Reframing  Summary: Michelle Richardson is a 23 y.o. female who presents with symptoms of anxiety and depression including but not limited to uncontrollable worry, anxious feelings, racing thoughts, difficulty concentrating, restlessness, low mood. Pt was oriented times 5. Pt was cooperative and engaged. Pt denies SI/HI/AVH.   Cln utilized the first half of session to review patients progress. See progress notes documented above. The patient reports she averages around 3 hours of sleep at night which she feels interferes with her ability to cope with her anxiety.  Patient reports she is also grappling with the  utilization of assertive communication to establish healthier boundaries as she often experiences disruptions when she is trying to sleep following her third shift.  The clinician readministered the PHQ-9 and GAD-7 assessments. The patient's anxiety scores increased from 9 to 10, and depression scores also decreased from 16 to 13.   Patient processed recent distressing events and realizations of inappropriate actions of others within her social circle.  Clinician and patient work together to identify and role-played ways in which she can address her discomfort.  Reflected on her social battery and components that draw out energy versus what and whom recuperates her energy.   The clinician stressed patient's need to ensure basic necessities are being met specifically regarding consistent healthy sleeping habits.  Patient agreed and worked with clinician to determine steps she can take to begin to establish a healthier sleep routine.  In addition utilize CBT to address patient's negative cognitions regarding self-care and sleep.  Suicidal/Homicidal: Nowithout intent/plan  Therapist Response: Clinician utilized active and supportive reflection to create a safe space for patient to process recent life events. Clinician assessed for current symptoms, stressors, safety since last session.  Continue to reflect on use of assertive communication to establish much-needed boundaries within her support circle.  Reflected on ways in which patient can engage in self-care.  Plan: Return again in 2 weeks.  Diagnosis: Recurrent major depressive disorder, in partial remission (HCC)  Generalized anxiety disorder   Collaboration of Care: : AEB psychiatrist can access notes and cln. Will review psychiatrists' notes. Check in with the patient and will see LCSW per availability. Patient agreed with treatment recommendations.   Patient/Guardian was advised Release of Information must be obtained prior to any record  release in order to collaborate their care with an outside provider. Patient/Guardian was advised if they have not already done so to contact the registration department to sign all necessary forms in order for us  to release information regarding their care.   Consent: Patient/Guardian gives verbal consent for treatment and assignment of benefits for services provided during this visit. Patient/Guardian expressed understanding and agreed to proceed.   [name], LCSW 12/27/2023

## 2024-01-11 ENCOUNTER — Ambulatory Visit: Admitting: Licensed Clinical Social Worker

## 2024-01-11 DIAGNOSIS — F411 Generalized anxiety disorder: Secondary | ICD-10-CM | POA: Diagnosis not present

## 2024-01-11 DIAGNOSIS — F3341 Major depressive disorder, recurrent, in partial remission: Secondary | ICD-10-CM

## 2024-01-11 NOTE — Progress Notes (Signed)
 THERAPIST PROGRESS NOTE  Session Time: 8:03am-8:58am  Participation Level: Active  Behavioral Response: CasualAlertEuthymic  Type of Therapy: Individual Therapy  Treatment Goals addressed:  Active     Anxiety     LTG: Birdella will score less than 5 on the Generalized Anxiety Disorder 7 Scale (GAD-7)  (Progressing)     Start:  03/02/23    Expected End:  03/27/24       Goal Note     12/27/23: Pt reports her anxiety has been worse as a result of life circumstances. Shares she has been using her coping skills.          STG: Emila will practice problem solving skills 3 times per week for the next 4 weeks.  (Progressing)     Start:  03/02/23    Expected End:  03/27/24         Work with patient individually to identify the major components of a recent episode of anxiety: physical symptoms, major thoughts and images, and major behaviors they experienced     Start:  03/02/23         Perform motivational interviewing regarding use of tools     Start:  03/02/23           OP Depression     LTG: Reduce frequency, intensity, and duration of depression symptoms so that daily functioning is improved (Progressing)     Start:  03/02/23    Expected End:  03/27/24       Goal Note     12/27/23: Pt reports she feels her anxiety is overwhelming her depressive symptoms therefore, she feels she cannot report on her depression.          STG: Wing will identify cognitive patterns and beliefs that support depression (Progressing)     Start:  03/02/23    Expected End:  03/27/24         STG: Enijah will practice behavioral activation skills 2 times per week for the next 8 weeks (Progressing)     Start:  03/02/23    Expected End:  03/27/24         Therapist will educate patient on cognitive distortions and the rationale for treatment of depression     Start:  03/02/23         Saysha will identify 3 cognitive distortions they are currently using and write reframing statements to  replace them     Start:  03/02/23         Ellieana will review pleasant activities list and select 2 activities to practice weekly for the next 8 weeks     Start:  03/02/23           Social Interpersonal Effectiveness     LTG: Willella will attend and participate in therapeutic, recreational and educational activities that support interpersonal effectiveness   (Not Applicable)     Start:  03/02/23    Expected End:  01/23/24    Resolved:  12/27/23      LTG: Wiletta will recognize socially inappropriate behaviors and develop alternative behaviors (Progressing)     Start:  03/02/23    Expected End:  03/27/24       Goal Note     12/27/23: Pt reports improvement as she is beginning to establish boundaries with others and be more mindful of engaging in acttivities she wants to engage in.          STG: Taegan will identify 2 behaviors that engage in social isolation  (  Progressing)     Start:  03/02/23    Expected End:  03/27/24         Encourage and recognize when Lateia displays appropriate boundaries and behaviors     Start:  03/02/23         Work with [name] to identify 2 signs that they are engaging in social isolation     Start:  03/02/23            ProgressTowards Goals: Progressing  Interventions: CBT, Solution Focused, Strength-based, Supportive, and Reframing  Summary:  Michelle Richardson is a 23 y.o. female who presents with symptoms of anxiety and depression including but not limited to uncontrollable worry, anxious feelings, racing thoughts, difficulty concentrating, restlessness, low mood. Pt was oriented times 5. Pt was cooperative and engaged. Pt denies SI/HI/AVH.   The patient reflected on recent experience with feelings of loneliness. She shared that she enjoys isolating herself at times but has been feeling emotionally isolated lately. She expressed a sense of purposelessness in her life and identified triggers, particularly her tendency to compare herself to others on  social media. During the session, we worked on reframing her perspective and acknowledging the efforts she has made.   We focused on reshaping her negative thoughts about her progress in life and identified her strengths, as she noted that she has grown emotionally and matured. We also discussed her upcoming birthday and her hopes for turning 23. The patient set goals for herself, including stopping being hard on myself, giving myself more grace, and being intentional about the words that come out of my mouth.   Together, we reflected on actionable steps she can take to achieve these goals, including limiting her exposure to social media. I encouraged her to actively reframe any negative statements she makes about herself soon after expressing them.  For homework, patient was asked to construct a journal entry reflecting on what she learned in year 22 versus what she hopes to grow into and year 75.  Suicidal/Homicidal: Nowithout intent/plan  Therapist Response: Clinician utilized active and supportive reflection to create a safe space for patient to process recent life events. Clinician assessed for current symptoms, stressors, safety since last session.  Utilized CBT to reframe patient's negative cognitions about herself.  Identified solutions and goals patient can be mindful of moving forward into her 23st birthday.  Plan: Return again in 2 weeks.  Diagnosis: Recurrent major depressive disorder, in partial remission (HCC)  Generalized anxiety disorder   Collaboration of Care: AEB psychiatrist can access notes and cln. Will review psychiatrists' notes. Check in with the patient and will see LCSW per availability. Patient agreed with treatment recommendations.   Patient/Guardian was advised Release of Information must be obtained prior to any record release in order to collaborate their care with an outside provider. Patient/Guardian was advised if they have not already done so to contact the  registration department to sign all necessary forms in order for us  to release information regarding their care.   Consent: Patient/Guardian gives verbal consent for treatment and assignment of benefits for services provided during this visit. Patient/Guardian expressed understanding and agreed to proceed.   [name], LCSW 01/11/2024

## 2024-01-23 ENCOUNTER — Ambulatory Visit: Admitting: Licensed Clinical Social Worker

## 2024-01-23 DIAGNOSIS — F411 Generalized anxiety disorder: Secondary | ICD-10-CM | POA: Diagnosis not present

## 2024-01-23 DIAGNOSIS — F3341 Major depressive disorder, recurrent, in partial remission: Secondary | ICD-10-CM | POA: Diagnosis not present

## 2024-01-23 NOTE — Progress Notes (Signed)
 THERAPIST PROGRESS NOTE  Session Time: 8:02-8:55am  Participation Level: Active  Behavioral Response: CasualAlertEuthymic  Type of Therapy: Individual Therapy  Treatment Goals addressed:  Active     Anxiety     LTG: Michelle Richardson will score less than 5 on the Generalized Anxiety Disorder 7 Scale (GAD-7)  (Progressing)     Start:  03/02/23    Expected End:  03/27/24       Goal Note     12/27/23: Pt reports her anxiety has been worse as a result of life circumstances. Shares she has been using her coping skills.          STG: Michelle Richardson will practice problem solving skills 3 times per week for the next 4 weeks.  (Progressing)     Start:  03/02/23    Expected End:  03/27/24         Work with patient individually to identify the major components of a recent episode of anxiety: physical symptoms, major thoughts and images, and major behaviors they experienced     Start:  03/02/23         Perform motivational interviewing regarding use of tools     Start:  03/02/23           OP Depression     LTG: Reduce frequency, intensity, and duration of depression symptoms so that daily functioning is improved (Progressing)     Start:  03/02/23    Expected End:  03/27/24       Goal Note     12/27/23: Pt reports she feels her anxiety is overwhelming her depressive symptoms therefore, she feels she cannot report on her depression.          STG: Michelle Richardson will identify cognitive patterns and beliefs that support depression (Progressing)     Start:  03/02/23    Expected End:  03/27/24         STG: Michelle Richardson will practice behavioral activation skills 2 times per week for the next 8 weeks (Progressing)     Start:  03/02/23    Expected End:  03/27/24         Therapist will educate patient on cognitive distortions and the rationale for treatment of depression     Start:  03/02/23         Michelle Richardson will identify 3 cognitive distortions they are currently using and write reframing statements to  replace them     Start:  03/02/23         Michelle Richardson will review pleasant activities list and select 2 activities to practice weekly for the next 8 weeks     Start:  03/02/23           Social Interpersonal Effectiveness     LTG: Michelle Richardson will attend and participate in therapeutic, recreational and educational activities that support interpersonal effectiveness   (Not Applicable)     Start:  03/02/23    Expected End:  01/23/24    Resolved:  12/27/23      LTG: Michelle Richardson will recognize socially inappropriate behaviors and develop alternative behaviors (Progressing)     Start:  03/02/23    Expected End:  03/27/24       Goal Note     12/27/23: Pt reports improvement as she is beginning to establish boundaries with others and be more mindful of engaging in acttivities she wants to engage in.          STG: Michelle Richardson will identify 2 behaviors that engage in social isolation  (  Progressing)     Start:  03/02/23    Expected End:  03/27/24         Encourage and recognize when Michelle Richardson displays appropriate boundaries and behaviors     Start:  03/02/23         Work with [name] to identify 2 signs that they are engaging in social isolation     Start:  03/02/23            ProgressTowards Goals: Progressing  Interventions: Strength-based, Conservator, museum/gallery, and Supportive  Summary:  Michelle Richardson is a 23 y.o. female who presents with symptoms of anxiety and depression including but not limited to uncontrollable worry, anxious feelings, racing thoughts, difficulty concentrating, restlessness, low mood. Pt was oriented times 5. Pt was cooperative and engaged. Pt denies SI/HI/AVH.   Patient utilized therapeutic space to continue to reflect on lessons learned from previous year as well as her hopes for her upcoming 23st birthday.  Patient identified she has grown in an effort to prioritize self-care specifically regarding sleep.  Patient reports in order to do so she had to establish firm boundaries with  those within her support circle.  Reports she has grown comfortable with establishing boundaries and has been pleasantly surprised by everybody else's understanding.  Patient identified tendencies to judge previous decisions.  Clinician worked with we will reframe this perspective.   Reflected on the relationship with her father and journey towards forgiveness.  Patient identified generational trauma specifically from her father side and the impact to his mental health and therefore their relationship.  Patient identified personal boundaries she is established with herself regarding communication with her father.  Reflected on ways patient can continue to be aware and work towards modifying behaviors as a result of generational trauma.  Suicidal/Homicidal: Nowithout intent/plan  Therapist Response:  Clinician utilized active and supportive reflection to create a safe space for patient to process recent life events. Clinician assessed for current symptoms, stressors, safety since last session.  Reflected on and celebrated patient's use of assertive communication and explored ways she can continue to establish boundaries.  Reflected on the impact of generational trauma on her own behaviors and ways of coping.   Plan: Return again in 2 weeks.  Diagnosis: Recurrent major depressive disorder, in partial remission  Generalized anxiety disorder   Collaboration of Care: AEB psychiatrist can access notes and cln. Will review psychiatrists' notes. Check in with the patient and will see LCSW per availability. Patient agreed with treatment recommendations.   Patient/Guardian was advised Release of Information must be obtained prior to any record release in order to collaborate their care with an outside provider. Patient/Guardian was advised if they have not already done so to contact the registration department to sign all necessary forms in order for us  to release information regarding their care.    Consent: Patient/Guardian gives verbal consent for treatment and assignment of benefits for services provided during this visit. Patient/Guardian expressed understanding and agreed to proceed.   [name], LCSW 01/23/2024

## 2024-01-24 ENCOUNTER — Ambulatory Visit: Admitting: Licensed Clinical Social Worker

## 2024-01-25 ENCOUNTER — Ambulatory Visit

## 2024-01-25 ENCOUNTER — Other Ambulatory Visit: Payer: Self-pay | Admitting: Family Medicine

## 2024-01-25 DIAGNOSIS — Z30011 Encounter for initial prescription of contraceptive pills: Secondary | ICD-10-CM

## 2024-01-26 ENCOUNTER — Ambulatory Visit (INDEPENDENT_AMBULATORY_CARE_PROVIDER_SITE_OTHER)

## 2024-01-26 DIAGNOSIS — Z30011 Encounter for initial prescription of contraceptive pills: Secondary | ICD-10-CM

## 2024-01-26 DIAGNOSIS — Z3042 Encounter for surveillance of injectable contraceptive: Secondary | ICD-10-CM | POA: Diagnosis not present

## 2024-01-26 MED ORDER — MEDROXYPROGESTERONE ACETATE 150 MG/ML IM SUSY
150.0000 mg | PREFILLED_SYRINGE | Freq: Once | INTRAMUSCULAR | Status: AC
  Administered 2024-01-26: 150 mg via INTRAMUSCULAR

## 2024-02-07 ENCOUNTER — Ambulatory Visit: Admitting: Licensed Clinical Social Worker

## 2024-02-07 DIAGNOSIS — F411 Generalized anxiety disorder: Secondary | ICD-10-CM

## 2024-02-07 DIAGNOSIS — F3341 Major depressive disorder, recurrent, in partial remission: Secondary | ICD-10-CM

## 2024-02-07 NOTE — Progress Notes (Signed)
 THERAPIST PROGRESS NOTE  Session Time: 8:03am-8:47am  Participation Level: Active  Behavioral Response: CasualAlertEuthymic  Type of Therapy: Individual Therapy  Treatment Goals addressed:  Active     Anxiety     LTG: Tamra will score less than 5 on the Generalized Anxiety Disorder 7 Scale (GAD-7)  (Progressing)     Start:  03/02/23    Expected End:  03/27/24       Goal Note     12/27/23: Pt reports her anxiety has been worse as a result of life circumstances. Shares she has been using her coping skills.          STG: Angelee will practice problem solving skills 3 times per week for the next 4 weeks.  (Progressing)     Start:  03/02/23    Expected End:  03/27/24         Work with patient individually to identify the major components of a recent episode of anxiety: physical symptoms, major thoughts and images, and major behaviors they experienced     Start:  03/02/23         Perform motivational interviewing regarding use of tools     Start:  03/02/23           OP Depression     LTG: Reduce frequency, intensity, and duration of depression symptoms so that daily functioning is improved (Progressing)     Start:  03/02/23    Expected End:  03/27/24       Goal Note     12/27/23: Pt reports she feels her anxiety is overwhelming her depressive symptoms therefore, she feels she cannot report on her depression.          STG: Katera will identify cognitive patterns and beliefs that support depression (Progressing)     Start:  03/02/23    Expected End:  03/27/24         STG: Jazzalyn will practice behavioral activation skills 2 times per week for the next 8 weeks (Completed/Met)     Start:  03/02/23    Expected End:  03/27/24    Resolved:  02/07/24      Demonstrates progress in stages of grief at own pace     Start:  02/07/24    Expected End:  03/27/24            Therapist will educate patient on cognitive distortions and the rationale for treatment of depression      Start:  03/02/23         Rylah will identify 3 cognitive distortions they are currently using and write reframing statements to replace them     Start:  03/02/23         Malene will review pleasant activities list and select 2 activities to practice weekly for the next 8 weeks     Start:  03/02/23         Educate patient on: Stages of grief     Start:  02/07/24              Social Interpersonal Effectiveness     LTG: Triva will attend and participate in therapeutic, recreational and educational activities that support interpersonal effectiveness   (Not Applicable)     Start:  03/02/23    Expected End:  01/23/24    Resolved:  12/27/23      LTG: Renu will recognize socially inappropriate behaviors and develop alternative behaviors (Completed/Met)     Start:  03/02/23    Expected  End:  03/27/24    Resolved:  02/07/24      STG: Tekeyah will identify 2 behaviors that engage in social isolation  (Completed/Met)     Start:  03/02/23    Expected End:  03/27/24    Resolved:  02/07/24      Encourage and recognize when [name] displays appropriate boundaries and behaviors     Start:  03/02/23         Work with [name] to identify 2 signs that they are engaging in social isolation     Start:  03/02/23            ProgressTowards Goals: Progressing  Interventions: Solution Focused, Strength-based, Conservator, museum/gallery, and Supportive  Summary: Michelle Richardson is a 23y.o. female who presents with symptoms of anxiety and depression including but not limited to uncontrollable worry, anxious feelings, racing thoughts, difficulty concentrating, restlessness, low mood. Pt was oriented times 5. Pt was cooperative and engaged. Pt denies SI/HI/AVH.   Patient utilized therapeutic space to reflect on recent birthday reporting she felt appreciated and loved.  Reports she has established healthier boundaries with family and friends in an effort to improve sleep which is impacted due to  working third shift.  Patient identified she is sleeping more consistently around 5 hours a day.  Identified this is overall improved her mental health as well as her ability to remain calm.  Patient celebrated efforts to establish healthier boundaries with work.  Patient reflected on recent situation with relatives reporting improvements in her ability to remain calm and confrontation and marked improvement in her ability to talk about her feelings.  Clinician assessed for patient's new goals as she has made great improvements in her communication and emotional regulation.  Patient identified a goal to work on unresolved grief as she has been thinking about her grandparents more.  Suicidal/Homicidal: Nowithout intent/plan  Therapist Response: Clinician utilized active and supportive reflection to create a safe space for patient to process recent life events. Clinician assessed for current symptoms, stressors, safety since last session.  With patient to reflect on growth and celebrate patient's use of assertive communication.  Identified new goals patient can begin to work towards in therapy.  Plan: Return again in 3 weeks.  Diagnosis: Recurrent major depressive disorder, in partial remission  Generalized anxiety disorder   Collaboration of Care: AEB psychiatrist can access notes and cln. Will review psychiatrists' notes. Check in with the patient and will see LCSW per availability. Patient agreed with treatment recommendations.   Patient/Guardian was advised Release of Information must be obtained prior to any record release in order to collaborate their care with an outside provider. Patient/Guardian was advised if they have not already done so to contact the registration department to sign all necessary forms in order for us  to release information regarding their care.   Consent: Patient/Guardian gives verbal consent for treatment and assignment of benefits for services provided during this  visit. Patient/Guardian expressed understanding and agreed to proceed.   Michelle Richardson Husband, LCSW 02/07/2024

## 2024-02-19 ENCOUNTER — Ambulatory Visit: Admitting: Psychiatry

## 2024-02-20 ENCOUNTER — Encounter: Payer: Self-pay | Admitting: Family Medicine

## 2024-02-21 ENCOUNTER — Ambulatory Visit: Admitting: Licensed Clinical Social Worker

## 2024-02-27 ENCOUNTER — Encounter: Payer: Self-pay | Admitting: Licensed Clinical Social Worker

## 2024-02-29 ENCOUNTER — Ambulatory Visit: Admitting: Licensed Clinical Social Worker

## 2024-02-29 DIAGNOSIS — F3341 Major depressive disorder, recurrent, in partial remission: Secondary | ICD-10-CM

## 2024-02-29 DIAGNOSIS — F411 Generalized anxiety disorder: Secondary | ICD-10-CM

## 2024-02-29 NOTE — Progress Notes (Signed)
 THERAPIST PROGRESS NOTE  Session Time: 9:03-9:53am  Participation Level: Active  Behavioral Response: CasualAlertEuthymic  Type of Therapy: Individual Therapy  Treatment Goals addressed:  Active     Anxiety     LTG: Michelle Richardson will score less than 5 on the Generalized Anxiety Disorder 7 Scale (GAD-7)  (Progressing)     Start:  03/02/23    Expected End:  03/27/24       Goal Note     12/27/23: Pt reports her anxiety has been worse as a result of life circumstances. Shares she has been using her coping skills.          STG: Michelle Richardson will practice problem solving skills 3 times per week for the next 4 weeks.  (Progressing)     Start:  03/02/23    Expected End:  03/27/24         Work with patient individually to identify the major components of a recent episode of anxiety: physical symptoms, major thoughts and images, and major behaviors they experienced     Start:  03/02/23         Perform motivational interviewing regarding use of tools     Start:  03/02/23           OP Depression     LTG: Reduce frequency, intensity, and duration of depression symptoms so that daily functioning is improved (Progressing)     Start:  03/02/23    Expected End:  03/27/24       Goal Note     12/27/23: Pt reports she feels her anxiety is overwhelming her depressive symptoms therefore, she feels she cannot report on her depression.          STG: Michelle Richardson will identify cognitive patterns and beliefs that support depression (Progressing)     Start:  03/02/23    Expected End:  03/27/24         STG: Michelle Richardson will practice behavioral activation skills 2 times per week for the next 8 weeks (Completed/Met)     Start:  03/02/23    Expected End:  03/27/24    Resolved:  02/07/24      Demonstrates progress in stages of grief at own pace     Start:  02/07/24    Expected End:  03/27/24            Therapist will educate patient on cognitive distortions and the rationale for treatment of depression      Start:  03/02/23         Michelle Richardson will identify 3 cognitive distortions they are currently using and write reframing statements to replace them     Start:  03/02/23         Michelle Richardson will review pleasant activities list and select 2 activities to practice weekly for the next 8 weeks     Start:  03/02/23         Educate patient on: Stages of grief     Start:  02/07/24              Social Interpersonal Effectiveness     LTG: Michelle Richardson will attend and participate in therapeutic, recreational and educational activities that support interpersonal effectiveness   (Not Applicable)     Start:  03/02/23    Expected End:  01/23/24    Resolved:  12/27/23      LTG: Michelle Richardson will recognize socially inappropriate behaviors and develop alternative behaviors (Completed/Met)     Start:  03/02/23    Expected  End:  03/27/24    Resolved:  02/07/24      STG: Michelle Richardson will identify 2 behaviors that engage in social isolation  (Completed/Met)     Start:  03/02/23    Expected End:  03/27/24    Resolved:  02/07/24      Encourage and recognize when Michelle Richardson displays appropriate boundaries and behaviors     Start:  03/02/23         Work with Michelle Richardson to identify 2 signs that they are engaging in social isolation     Start:  03/02/23            ProgressTowards Goals: Progressing  Interventions: Strength-based, Conservator, Museum/gallery, and Supportive  Summary:  Michelle Richardson is a 23y.o. female who presents with symptoms of anxiety and depression including but not limited to uncontrollable worry, anxious feelings, racing thoughts, difficulty concentrating, restlessness, low mood. Pt was oriented times 5. Pt was cooperative and engaged. Pt denies SI/HI/AVH.   Patient utilized therapeutic space to reflect on improvements citing increased prioritization of sleep through establishment of healthier boundaries.  Patient reports through the process of prioritizing her wellbeing over others she has noticed improvements  with depression and anxiety.  Patient also reports she is growing more comfortable and being emotionally vulnerable and communicating openly with those in her support circle.  Patient also reflected on excitement about the process of applying to college.  Patient also reflected on recent experiences with communicating with her mother.  Reports she has improved active listening skills and use of assertive communication.  Suicidal/Homicidal: Nowithout intent/plan  Therapist Response:  Clinician utilized active and supportive reflection to create a safe space for patient to process recent life events. Clinician assessed for current symptoms, stressors, safety since last session.  Reflected on growth since beginning therapy and improvements with use of assertive communication.  Plan: Return again in 3 weeks.  Diagnosis: Recurrent major depressive disorder, in partial remission  Generalized anxiety disorder    Collaboration of Care: AEB psychiatrist can access notes and cln. Will review psychiatrists' notes. Check in with the patient and will see LCSW per availability. Patient agreed with treatment recommendations.   Patient/Guardian was advised Release of Information must be obtained prior to any record release in order to collaborate their care with an outside provider. Patient/Guardian was advised if they have not already done so to contact the registration department to sign all necessary forms in order for us  to release information regarding their care.   Consent: Patient/Guardian gives verbal consent for treatment and assignment of benefits for services provided during this visit. Patient/Guardian expressed understanding and agreed to proceed.   [name], LCSW 02/29/2024

## 2024-03-06 ENCOUNTER — Ambulatory Visit: Admitting: Licensed Clinical Social Worker

## 2024-03-11 ENCOUNTER — Other Ambulatory Visit: Payer: Self-pay

## 2024-03-11 ENCOUNTER — Ambulatory Visit (INDEPENDENT_AMBULATORY_CARE_PROVIDER_SITE_OTHER): Admitting: Psychiatry

## 2024-03-11 ENCOUNTER — Encounter: Payer: Self-pay | Admitting: Psychiatry

## 2024-03-11 VITALS — BP 130/85 | HR 83 | Temp 97.4°F | Ht 69.5 in | Wt 299.4 lb

## 2024-03-11 DIAGNOSIS — F411 Generalized anxiety disorder: Secondary | ICD-10-CM

## 2024-03-11 DIAGNOSIS — F3342 Major depressive disorder, recurrent, in full remission: Secondary | ICD-10-CM | POA: Diagnosis not present

## 2024-03-11 DIAGNOSIS — F5101 Primary insomnia: Secondary | ICD-10-CM

## 2024-03-11 MED ORDER — PROPRANOLOL HCL 10 MG PO TABS: 10.0000 mg | ORAL_TABLET | Freq: Two times a day (BID) | ORAL | 0 refills | Status: AC | PRN

## 2024-03-11 MED ORDER — BUSPIRONE HCL 5 MG PO TABS: 5.0000 mg | ORAL_TABLET | Freq: Two times a day (BID) | ORAL | 0 refills | Status: AC

## 2024-03-11 MED ORDER — DULOXETINE HCL 20 MG PO CPEP: 20.0000 mg | ORAL_CAPSULE | Freq: Every day | ORAL | 0 refills | Status: AC

## 2024-03-11 MED ORDER — PRAZOSIN HCL 1 MG PO CAPS
ORAL_CAPSULE | ORAL | 0 refills | Status: AC
Start: 2024-03-11 — End: ?

## 2024-03-11 MED ORDER — DULOXETINE HCL 60 MG PO CPEP: 60.0000 mg | ORAL_CAPSULE | Freq: Every day | ORAL | 0 refills | Status: AC

## 2024-03-11 NOTE — Patient Instructions (Incomplete)
Preventive Care 21-21 Years Old, Female Preventive care refers to lifestyle choices and visits with your health care provider that can promote health and wellness. At this stage in your life, you may start seeing a primary care physician instead of a pediatrician for your preventive care. Preventive care visits are also called wellness exams. What can I expect for my preventive care visit? Counseling During your preventive care visit, your health care provider may ask about your: Medical history, including: Past medical problems. Family medical history. Pregnancy history. Current health, including: Menstrual cycle. Method of birth control. Emotional well-being. Home life and relationship well-being. Sexual activity and sexual health. Lifestyle, including: Alcohol, nicotine or tobacco, and drug use. Access to firearms. Diet, exercise, and sleep habits. Sunscreen use. Motor vehicle safety. Physical exam Your health care provider may check your: Height and weight. These may be used to calculate your BMI (body mass index). BMI is a measurement that tells if you are at a healthy weight. Waist circumference. This measures the distance around your waistline. This measurement also tells if you are at a healthy weight and may help predict your risk of certain diseases, such as type 2 diabetes and high blood pressure. Heart rate and blood pressure. Body temperature. Skin for abnormal spots. Breasts. What immunizations do I need?  Vaccines are usually given at various ages, according to a schedule. Your health care provider will recommend vaccines for you based on your age, medical history, and lifestyle or other factors, such as travel or where you work. What tests do I need? Screening Your health care provider may recommend screening tests for certain conditions. This may include: Vision and hearing tests. Lipid and cholesterol levels. Pelvic exam and Pap test. Hepatitis B  test. Hepatitis C test. HIV (human immunodeficiency virus) test. STI (sexually transmitted infection) testing, if you are at risk. Tuberculosis skin test if you have symptoms. BRCA-related cancer screening. This may be done if you have a family history of breast, ovarian, tubal, or peritoneal cancers. Talk with your health care provider about your test results, treatment options, and if necessary, the need for more tests. Follow these instructions at home: Eating and drinking  Eat a healthy diet that includes fresh fruits and vegetables, whole grains, lean protein, and low-fat dairy products. Drink enough fluid to keep your urine pale yellow. Do not drink alcohol if: Your health care provider tells you not to drink. You are pregnant, may be pregnant, or are planning to become pregnant. You are under the legal drinking age. In the [place] the legal drinking age is 21. If you drink alcohol: Limit how much you have to 0-1 drink a day. Know how much alcohol is in your drink. In the [place] one drink equals one 12 oz bottle of beer (355 mL), one 5 oz glass of wine (148 mL), or one 1 oz glass of hard liquor (44 mL). Lifestyle Brush your teeth every morning and night with fluoride toothpaste. Floss one time each day. Exercise for at least 30 minutes 5 or more days of the week. Do not use any products that contain nicotine or tobacco. These products include cigarettes, chewing tobacco, and vaping devices, such as e-cigarettes. If you need help quitting, ask your health care provider. Do not use drugs. If you are sexually active, practice safe sex. Use a condom or other form of protection to prevent STIs. If you do not wish to become pregnant, use a form of birth control. If you plan to become pregnant,   see your health care provider for a prepregnancy visit. Find healthy ways to manage stress, such as: Meditation, yoga, or listening to music. Journaling. Talking to a trusted person. Spending time  with friends and family. Safety Always wear your seat belt while driving or riding in a vehicle. Do not drive: If you have been drinking alcohol. Do not ride with someone who has been drinking. When you are tired or distracted. While texting. If you have been using any mind-altering substances or drugs. Wear a helmet and other protective equipment during sports activities. If you have firearms in your house, make sure you follow all gun safety procedures. Seek help if you have been bullied, physically abused, or sexually abused. Use the internet responsibly to avoid dangers, such as online bullying and online sex predators. What's next? Go to your health care provider once a year for an annual wellness visit. Ask your health care provider how often you should have your eyes and teeth checked. Stay up to date on all vaccines. This information is not intended to replace advice given to you by your health care provider. Make sure you discuss any questions you have with your health care provider. Document Revised: 10/07/2020 Document Reviewed: 10/07/2020 Elsevier Patient Education  2024 Elsevier Inc.  

## 2024-03-11 NOTE — Progress Notes (Signed)
 [facility] MD OP Progress Note  03/11/2024 9:34 AM Michelle Richardson  MRN:  [id number]  Chief Complaint:  Chief Complaint  Patient presents with   Follow-up   Anxiety   Depression   Medication Refill   Discussed the use of AI scribe software for clinical note transcription with the patient, who gave verbal consent to proceed.  History of Present Illness Michelle Richardson is a 23 year old African-American female, currently employed, lives in [place] with her mother, has a history of MDD, GAD, insomnia was evaluated in office today for a follow-up appointment.    Ongoing anxiety affects her daily life, and she describes feeling nervous all the time due to numerous changes. Upcoming transitions, including planning to attend school and shifting family responsibilities, serve as significant stressors. She expresses concern about her family's perception of her decisions, fearing they may view her as selfish for prioritizing her own growth and independence. 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  She reports her mood is generally okay, and she states she has come to terms with many things. She reports occasional waves of hopelessness have occurred in the past couple of weeks, which she relates to her environment and feeling unable to do what she wants. She denies significant loss of interest or pleasure in activities, noting increased motivation and engagement in crocheting, especially as she prepares gifts for Christmas. Progress on a large project for her boyfriend excites her, and she feels positive about these activities.  She reports no current thoughts of self-harm, suicidal ideation, or thoughts of harming others.  She reports her sleep varies, and she has started prioritizing sleep more after encouragement from her therapist. She describes cutting off her phone during the day and using nature and rain sounds  to help her fall asleep, with sleep quality depending on daily events and her ability to manage ruminative thoughts.  Her current medications include duloxetine  80 mg, buspirone  5 mg twice daily, propranolol , and prazosin  1 mg. She does not report significant side effects. She expresses uncertainty about continuing duloxetine  and wonders about the process of tapering off, but she has not made any changes to her regimen.     Visit Diagnosis:    ICD-10-CM   1. Recurrent major depressive disorder, in full remission  F33.42 prazosin  (MINIPRESS ) 1 MG capsule    2. Generalized anxiety disorder  F41.1 busPIRone  (BUSPAR ) 5 MG tablet    DULoxetine  (CYMBALTA ) 20 MG capsule    propranolol  (INDERAL ) 10 MG tablet    3. Primary insomnia  F51.01 DULoxetine  (CYMBALTA ) 20 MG capsule    DULoxetine  (CYMBALTA ) 60 MG capsule      Past Psychiatric History: I have reviewed past psychiatric history from progress note on 03/30/2022.  ADHD testing completed by Dr. Ethelene 01/12/2023-does not meet criteria for ADHD.  May benefit from sleep apnea testing.  Past Medical History:  Past Medical History:  Diagnosis Date   Anemia    Anxiety    Asthma    Depression    Pediatric obesity    Suicide ideation 01/11/2019    Past Surgical History:  Procedure Laterality Date   TONSILLECTOMY AND ADENOIDECTOMY  08/02/2008    Family Psychiatric History: I have reviewed family psychiatric history from progress note on 03/30/2022.  Family History:  Family History  Problem Relation Age of Onset   Hypertension Mother    Drug abuse Father    ADD / ADHD  Sister    Autism spectrum disorder Sister    Obesity Sister    Cancer Maternal Grandmother    Diabetes Maternal Grandmother     Social History: Reviewed social history from progress note on 03/30/2022. Social History   Socioeconomic History   Marital status: Single    Spouse name: Not on file   Number of children: 0   Years of education: Not on file    Highest education level: 12th grade  Occupational History   Not on file  Tobacco Use   Smoking status: Never   Smokeless tobacco: Never  Vaping Use   Vaping status: Never Used  Substance and Sexual Activity   Alcohol use: Never   Drug use: Never   Sexual activity: Yes    Birth control/protection: Condom, Pill  Other Topics Concern   Not on file  Social History Narrative   Not on file   Social Drivers of Health   Financial Resource Strain: Low Risk  (05/23/2023)   Overall Financial Resource Strain (CARDIA)    Difficulty of Paying Living Expenses: Not very hard  Food Insecurity: No Food Insecurity (05/23/2023)   Hunger Vital Sign    Worried About Running Out of Food in the Last Year: Never true    Ran Out of Food in the Last Year: Never true  Transportation Needs: No Transportation Needs (05/23/2023)   PRAPARE - Administrator, Civil Service (Medical): No    Lack of Transportation (Non-Medical): No  Physical Activity: Sufficiently Active (05/23/2023)   Exercise Vital Sign    Days of Exercise per Week: 3 days    Minutes of Exercise per Session: 70 min  Stress: Stress Concern Present (05/23/2023)   [employer] of Occupational Health - Occupational Stress Questionnaire    Feeling of Stress : To some extent  Social Connections: Moderately Isolated (05/23/2023)   Social Connection and Isolation Panel    Frequency of Communication with Friends and Family: More than three times a week    Frequency of Social Gatherings with Friends and Family: Once a week    Attends Religious Services: 1 to 4 times per year    Active Member of [employer] or Organizations: No    Attends Engineer, Structural: Never    Marital Status: Never married    Allergies: No Known Allergies  Metabolic Disorder Labs: Lab Results  Component Value Date   HGBA1C 6.0 (H) 02/15/2023   MPG 126 02/15/2023   MPG 120 01/06/2022   Lab Results  Component Value Date   PROLACTIN 8.3 02/15/2023    PROLACTIN 11.1 01/06/2022   Lab Results  Component Value Date   CHOL 144 02/15/2023   TRIG 74 02/15/2023   HDL 60 02/15/2023   CHOLHDL 2.4 02/15/2023   VLDL 12 01/11/2019   LDLCALC 69 02/15/2023   LDLCALC 80 01/06/2022   Lab Results  Component Value Date   TSH 0.73 01/06/2022   TSH 0.863 01/11/2019    Therapeutic Level Labs: No results found for: LITHIUM No results found for: VALPROATE No results found for: CBMZ  Current Medications: Current Outpatient Medications  Medication Sig Dispense Refill   busPIRone  (BUSPAR ) 5 MG tablet Take 1 tablet (5 mg total) by mouth 2 (two) times daily. 180 tablet 0   Cholecalciferol (VITAMIN D ) 50 MCG (2000 UT) CAPS Take 1 capsule by mouth daily.     DULoxetine  (CYMBALTA ) 20 MG capsule Take 1 capsule (20 mg total) by mouth daily. Take along with 60  mg daily - total of 80 mg daily 90 capsule 0   DULoxetine  (CYMBALTA ) 60 MG capsule Take 1 capsule (60 mg total) by mouth daily. Take along with 20 mg daily 90 capsule 0   fluticasone -salmeterol (WIXELA INHUB) 250-50 MCG/ACT AEPB INHALE 1 PUFF INTO THE LUNGS IN THE MORNING AND AT BEDTIME. 180 each 3   medroxyPROGESTERone  (DEPO-PROVERA ) 150 MG/ML injection INJECT 1 ML (150 MG TOTAL) INTO THE MUSCLE EVERY 3 (THREE) MONTHS 1 mL 0   prazosin  (MINIPRESS ) 1 MG capsule TAKE 1 CAPSULE BY MOUTH EVERYDAY AT BEDTIME 90 capsule 0   propranolol  (INDERAL ) 10 MG tablet Take 1 tablet (10 mg total) by mouth 2 (two) times daily as needed. 180 tablet 0   Current Facility-Administered Medications  Medication Dose Route Frequency Provider Last Rate Last Admin   medroxyPROGESTERone  Acetate SUSY 150 mg  150 mg Intramuscular Q90 days Bernardo Fend, DO   150 mg at 08/09/23 1147     Musculoskeletal: Strength & Muscle Tone: within normal limits Gait & Station: normal Patient leans: N/A  Psychiatric Specialty Exam: Review of Systems  Psychiatric/Behavioral:  Positive for sleep disturbance. The patient is  nervous/anxious.     Blood pressure 130/85, pulse 83, temperature (!) 97.4 F (36.3 C), temperature source Temporal, height 5' 9.5 (1.765 m), weight 299 lb 6.4 oz (135.8 kg).Body mass index is 43.58 kg/m.  General Appearance: Casual  Eye Contact:  Fair  Speech:  Normal Rate  Volume:  Normal  Mood:  Anxious  Affect:  Appropriate  Thought Process:  Goal Directed and Descriptions of Associations: Intact  Orientation:  Full (Time, Place, and Person)  Thought Content: Logical   Suicidal Thoughts:  No  Homicidal Thoughts:  No  Memory:  Immediate;   Fair Recent;   Fair Remote;   Fair  Judgement:  Fair  Insight:  Fair  Psychomotor Activity:  Normal  Concentration:  Concentration: Fair and Attention Span: Fair  Recall:  [employer] of Knowledge: Fair  Language: Fair  Akathisia:  No  Handed:  Right  AIMS (if indicated): done  Assets:  Communication Skills Desire for Improvement Housing Social Support Transportation  ADL's:  Intact  Cognition: WNL  Sleep:  improving   Screenings: AIMS    Flowsheet Row [facility]  AIMS Total Score 0   GAD-7    Flowsheet Row Counselor from 12/27/2023 in [facility] Office Visit from 11/20/2023 in [facility] Office Visit from 11/01/2023 in [facility] Visit from 08/23/2023 in [facility] Office Visit from 05/24/2023 in [facility]  Total GAD-7 Score 10 10 21 10 7    PHQ2-9    Flowsheet Row Office Visit from 03/11/2024 in [facility] Counselor from 12/27/2023 in [facility] Office Visit from 11/20/2023 in [facility] Office Visit from 11/01/2023 in [facility] Visit from 08/23/2023 in [facility]  PHQ-2 Total Score 1 2 2 2 4   PHQ-9 Total Score -- 13 9 7 15    Flowsheet Row Office Visit from 03/11/2024 in [facility] Counselor from 12/27/2023 in [facility] Office Visit from 11/20/2023 in [facility]  C-SSRS RISK CATEGORY Moderate Risk Moderate Risk Moderate Risk  Assessment and Plan: Crystallee Roudebush is a 23 year old African-American female who presented for a follow-up appointment, discussed assessment and plan as noted below.  1. Recurrent major depressive disorder, in full remission Currently denies any significant depression symptoms and reports overall motivation has improved. Continue Duloxetine  80 mg daily Continue BuSpar  5 mg twice daily Continue psychotherapy sessions with Ms. Evalene Husband  2. Generalized anxiety disorder-improving Anxiety has improved although does have situational stressors. Continue CBT Continue Duloxetine  and BuSpar  as prescribed Continue Propranolol  10 mg twice a day as needed  3. Primary insomnia-improving Currently working on sleep hygiene Continue Prazosin  1 mg at bedtime Continue sleep hygiene techniques  Follow-up Follow-up in clinic in 3 months or sooner if needed.    Collaboration of Care: Collaboration of Care: Referral or follow-up with counselor/therapist AEB and patient to continue psychotherapy sessions reviewed notes per Ms. Perkins dated 02/29/2024-currently engaged in behavioral activation working on grief  Patient/Guardian was advised Release of Information must be obtained prior to any record release in order to collaborate their care with an outside provider. Patient/Guardian was advised if they have not already done so to contact the registration department to sign all necessary forms in order for us  to release  information regarding their care.   Consent: Patient/Guardian gives verbal consent for treatment and assignment of benefits for services provided during this visit. Patient/Guardian expressed understanding and agreed to proceed.   This note was generated in part or whole with voice recognition software. Voice recognition is usually quite accurate but there are transcription errors that can and very often do occur. I apologize for any typographical errors that were not detected and corrected.    Kord Monette, MD 03/12/2024, 8:05 AM

## 2024-03-12 ENCOUNTER — Encounter: Admitting: Family Medicine

## 2024-03-13 ENCOUNTER — Ambulatory Visit (INDEPENDENT_AMBULATORY_CARE_PROVIDER_SITE_OTHER): Admitting: Nurse Practitioner

## 2024-03-13 VITALS — BP 122/82 | HR 117 | Temp 100.9°F | Ht 69.0 in | Wt 297.0 lb

## 2024-03-13 DIAGNOSIS — R051 Acute cough: Secondary | ICD-10-CM | POA: Diagnosis not present

## 2024-03-13 DIAGNOSIS — J4520 Mild intermittent asthma, uncomplicated: Secondary | ICD-10-CM | POA: Diagnosis not present

## 2024-03-13 DIAGNOSIS — J029 Acute pharyngitis, unspecified: Secondary | ICD-10-CM

## 2024-03-13 LAB — POC COVID19/FLU A&B COMBO
Covid Antigen, POC: NEGATIVE
Influenza A Antigen, POC: NEGATIVE
Influenza B Antigen, POC: NEGATIVE

## 2024-03-13 MED ORDER — PREDNISONE 10 MG (21) PO TBPK: ORAL_TABLET | ORAL | 0 refills | Status: DC

## 2024-03-13 MED ORDER — BENZONATATE 100 MG PO CAPS: 100.0000 mg | ORAL_CAPSULE | Freq: Two times a day (BID) | ORAL | 0 refills | Status: DC | PRN

## 2024-03-13 NOTE — Assessment & Plan Note (Signed)
 Due to patient's hisotry of asthma along with cough, sent in steroid taper to hep with symptoms. Continue using albuterol inhaler and wixela.

## 2024-03-13 NOTE — Progress Notes (Signed)
 BP 122/82   Pulse (!) 117   Temp (!) 100.9 F (38.3 C)   Ht 5' 9 (1.753 m)   Wt 297 lb (134.7 kg)   SpO2 98%   BMI 43.86 kg/m    Subjective:    Patient ID: Michelle Richardson, female    DOB: 04/08/2001, 23 y.o.   MRN: [id number]  HPI: Meryem Haertel is a 23 y.o. female presenting today with a sore throat, cough, headache, congestion, and decreased appetite since Sunday. Endorses fever. Temp today 100.9. She has been using mucinex and ibuprofen for symptom relief. She denies any chest pain, but does endorse some shortness of breath when she starts coughing, which she contributes to her history of asthma. She has been using her albuterol inhaler and wixela, which provides some relief.         11 /17/2025    9:15 AM 12/27/2023    8:09 AM 11/20/2023    9:32 AM  Depression screen PHQ 2/9  Decreased Interest     Down, Depressed, Hopeless     PHQ - 2 Score     Altered sleeping     Tired, decreased energy     Change in appetite     Feeling bad or failure about yourself      Trouble concentrating     Moving slowly or fidgety/restless     Suicidal thoughts     PHQ-9 Score     Difficult doing work/chores        Information is confidential and restricted. Go to Review Flowsheets to unlock data.    Relevant past medical, surgical, family and social history reviewed and updated as indicated. Interim medical history since our last visit reviewed. Allergies and medications reviewed and updated.  Review of Systems  Ten systems reviewed and is negative except as mentioned in HPI       Objective:     BP 122/82   Pulse (!) 117   Temp (!) 100.9 F (38.3 C)   Ht 5' 9 (1.753 m)   Wt 297 lb (134.7 kg)   SpO2 98%   BMI 43.86 kg/m    Wt Readings from Last 3 Encounters:  03/13/24 297 lb (134.7 kg)  11/01/23 289 lb 8 oz (131.3 kg)  05/24/23 293 lb 6.4 oz (133.1 kg)    Physical Exam Constitutional:      Appearance: She is well-developed.  HENT:     Head: Normocephalic and atraumatic.      Right Ear: Tympanic membrane normal. No drainage.     Left Ear: Tympanic membrane normal. No drainage.     Nose: Congestion present.     Mouth/Throat:     Mouth: Mucous membranes are moist. No oral lesions.     Pharynx: Posterior oropharyngeal erythema present. No pharyngeal swelling or oropharyngeal exudate.  Eyes:     Conjunctiva/sclera: Conjunctivae normal.     Pupils: Pupils are equal, round, and reactive to light.  Cardiovascular:     Rate and Rhythm: Regular rhythm. Tachycardia present.     Heart sounds: Normal heart sounds.  Pulmonary:     Effort: Pulmonary effort is normal.     Breath sounds: Normal breath sounds.  Musculoskeletal:     Cervical back: Normal range of motion and neck supple.  Skin:    General: Skin is warm and dry.  Neurological:     General: No focal deficit present.     Mental Status: She is alert and oriented to person, place, and time.  Psychiatric:        Mood and Affect: Mood normal.        Behavior: Behavior normal.      Results for orders placed or performed in visit on 03/13/24  POC Covid19/Flu A&B Antigen   Collection Time: 03/13/24  9:58 AM  Result Value Ref Range   Influenza A Antigen, POC Negative Negative   Influenza B Antigen, POC Negative Negative   Covid Antigen, POC Negative Negative          Assessment & Plan:   Problem List Items Addressed This Visit       Respiratory   Asthma, mild intermittent, poorly controlled   Due to patient's hisotry of asthma along with cough, sent in steroid taper to hep with symptoms. Continue using albuterol inhaler and wixela.       Relevant Medications   predniSONE (STERAPRED UNI-PAK 21 TAB) 10 MG (21) TBPK tablet   Other Visit Diagnoses       Sore throat    -  Primary   Advised warm liquids with honey, throat losenges, and plenty of water.   Relevant Orders   POC Covid19/Flu A&B Antigen (Completed)     Acute cough       Tessalon  perles sent in to take twice daily as needed to help  wth cough. Steroid taper also sent in.   Relevant Medications   benzonatate  (TESSALON ) 100 MG capsule   predniSONE (STERAPRED UNI-PAK 21 TAB) 10 MG (21) TBPK tablet     COVID and Flu testing negative. Prescription sent in for tessalon  perles to help with cough along with steroid taper due to patient's history of asthma. Continue with albuterol and wixela. Advised plenty of rest and hydration. Recommended to take tylenol /ibuprofen to help bring down fever. Discussed with patient that she can alternate between these two medications to help with fever, aches, and pains. Continue with mucinex. Advised taking flonase to help with nasal congestion. She reports she has flonase at home. Advised to return in 3-4 days if symptoms do not improve. Discussed being fever free for 24 hours before returning to work.      Provided patient with information about switching from albuterol to airsupra.     Follow up plan: Return if symptoms worsen or fail to improve.   I have reviewed this encounter including the documentation in this note and/or discussed this patient with the provider, Aislinn Womack, SNP, I am certifying that I agree with the content of this note as supervising/preceptor nurse practitioner.  Mliss Spray, FNP-C [facility] 03/13/2024, 10:41 AM

## 2024-03-19 ENCOUNTER — Ambulatory Visit: Admitting: Licensed Clinical Social Worker

## 2024-03-19 DIAGNOSIS — F411 Generalized anxiety disorder: Secondary | ICD-10-CM | POA: Diagnosis not present

## 2024-03-19 DIAGNOSIS — F3342 Major depressive disorder, recurrent, in full remission: Secondary | ICD-10-CM

## 2024-03-19 NOTE — Progress Notes (Signed)
 THERAPIST PROGRESS NOTE  Session Time: 9:04am-9:55am  Participation Level: Active  Behavioral Response: CasualAlertEuthymic Anxious  Type of Therapy: Individual Therapy  Treatment Goals addressed:  Active     Anxiety     LTG: Seila will score less than 5 on the Generalized Anxiety Disorder 7 Scale (GAD-7)  (Completed/Met)     Start:  03/02/23    Expected End:  03/27/24    Resolved:  03/19/24      STG: Avneet will practice problem solving skills 3 times per week for the next 4 weeks.  (Completed/Met)     Start:  03/02/23    Expected End:  06/19/24    Resolved:  03/19/24      Work with patient individually to identify the major components of a recent episode of anxiety: physical symptoms, major thoughts and images, and major behaviors they experienced     Start:  03/02/23         Perform motivational interviewing regarding use of tools     Start:  03/02/23           OP Depression     LTG: Reduce frequency, intensity, and duration of depression symptoms so that daily functioning is improved (Progressing)     Start:  03/02/23    Expected End:  06/19/24         STG: Dynastie will identify cognitive patterns and beliefs that support depression (Progressing)     Start:  03/02/23    Expected End:  06/19/24       Goal Note     03/19/24: patient reflected on progress with her acceptance of letting go of control.          STG: Swan will practice behavioral activation skills 2 times per week for the next 8 weeks (Completed/Met)     Start:  03/02/23    Expected End:  03/27/24    Resolved:  02/07/24      Demonstrates progress in stages of grief at own pace (Progressing)     Start:  02/07/24    Expected End:  06/19/24          Goal Note     03/19/24: Reflected on her journey towards acceptance. Identified a desire to engage in conversations about her grandmother. Reports she has noticed a pattern of avoiding conversations around death or loved ones who've passed.  Reflected and challenged self judgement.          STG: Improve involvement of hobbies AEB engaging in painting or crocheting 2/7 days a week for 2 months.  (Initial)     Start:  03/19/24    Expected End:  06/19/24            Therapist will educate patient on cognitive distortions and the rationale for treatment of depression     Start:  03/02/23         Verleen will identify 3 cognitive distortions they are currently using and write reframing statements to replace them     Start:  03/02/23         Tanelle will review pleasant activities list and select 2 activities to practice weekly for the next 8 weeks     Start:  03/02/23         Educate patient on: Stages of grief     Start:  02/07/24               ProgressTowards Goals: Progressing  Interventions: Strength-based, Supportive, and Other: Grief work  Summary: Nicol Rusk is a 23y.o. female who presents with symptoms of anxiety and depression including but not limited to uncontrollable worry, anxious feelings, racing thoughts, difficulty concentrating, restlessness, low mood. Pt was oriented times 5. Pt was cooperative and engaged. Pt denies SI/HI/AVH.   Cln utilized the first half of the session to review the patient's progress, as documented in the progress notes above.  The clinician readministered the PHQ-9 and GAD-7 assessments. The patient's anxiety scores decreased from 10 to 4, and her depression scores also decreased from 13 to 4. The patient expressed that she has stopped caring about everything. She noted that a lot has happened in the past month and reported that she is being mindful of where she expends her energy. She mentioned her inability to feel positive emotions and indicated that this has been a recurring pattern, with symptoms persisting for 2-3 months.  The patient reflected on recent stressors related to her breakup with her partner. She is using assertive communication to establish boundaries within  unhealthy dynamics with others. She is progressing toward a state of acceptance by understanding that she cannot control other people's behaviors and choices, and she is coping with the uncertainty of the future.  Her goal is to reconnect with her hobbies, specifically painting and crocheting.  Patient reflected on grief. Reflected on her journey towards acceptance. Identified a desire to engage in conversations about her grandmother. Reports she has noticed a pattern of avoiding conversations around death or loved ones who've passed. Reflected and challenged self judgement.   Suicidal/Homicidal: Nowithout intent/plan  Therapist Response: Clinician utilized active and supportive reflection to create a safe space for patient to process recent life events. Clinician assessed for current symptoms, stressors, safety since last session. The clinician readministered the PHQ-9 and GAD-7 assessments.  Updated patient's treatment plan with new goals.  Began to reflect on intentions for progressing through grief work.  Plan: Return again in 2 weeks.  Diagnosis:Recurrent major depressive disorder, in full remission  Generalized anxiety disorder   Collaboration of Care: AEB psychiatrist can access notes and cln. Will review psychiatrists' notes. Check in with the patient and will see LCSW per availability. Patient agreed with treatment recommendations.   Patient/Guardian was advised Release of Information must be obtained prior to any record release in order to collaborate their care with an outside provider. Patient/Guardian was advised if they have not already done so to contact the registration department to sign all necessary forms in order for us  to release information regarding their care.   Consent: Patient/Guardian gives verbal consent for treatment and assignment of benefits for services provided during this visit. Patient/Guardian expressed understanding and agreed to proceed.   [name], LCSW 03/19/2024

## 2024-03-20 ENCOUNTER — Ambulatory Visit: Admitting: Licensed Clinical Social Worker

## 2024-04-09 ENCOUNTER — Ambulatory Visit (INDEPENDENT_AMBULATORY_CARE_PROVIDER_SITE_OTHER): Admitting: Licensed Clinical Social Worker

## 2024-04-09 DIAGNOSIS — F3342 Major depressive disorder, recurrent, in full remission: Secondary | ICD-10-CM | POA: Diagnosis not present

## 2024-04-09 DIAGNOSIS — F411 Generalized anxiety disorder: Secondary | ICD-10-CM | POA: Diagnosis not present

## 2024-04-09 NOTE — Progress Notes (Signed)
 THERAPIST PROGRESS NOTE  Session Time: 9:09am-10:03am  Participation Level: Active  Behavioral Response: CasualAlertEuthymic  Type of Therapy: Individual Therapy  Treatment Goals addressed:  Active     Anxiety     LTG: Eriel will score less than 5 on the Generalized Anxiety Disorder 7 Scale (GAD-7)  (Completed/Met)     Start:  03/02/23    Expected End:  03/27/24    Resolved:  03/19/24      STG: Malaya will practice problem solving skills 3 times per week for the next 4 weeks.  (Completed/Met)     Start:  03/02/23    Expected End:  06/19/24    Resolved:  03/19/24      Work with patient individually to identify the major components of a recent episode of anxiety: physical symptoms, major thoughts and images, and major behaviors they experienced     Start:  03/02/23         Perform motivational interviewing regarding use of tools     Start:  03/02/23           OP Depression     LTG: Reduce frequency, intensity, and duration of depression symptoms so that daily functioning is improved (Progressing)     Start:  03/02/23    Expected End:  06/19/24         STG: Bonni will identify cognitive patterns and beliefs that support depression (Progressing)     Start:  03/02/23    Expected End:  06/19/24       Goal Note     03/19/24: patient reflected on progress with her acceptance of letting go of control.          STG: [name] will practice behavioral activation skills 2 times per week for the next 8 weeks (Completed/Met)     Start:  03/02/23    Expected End:  03/27/24    Resolved:  02/07/24      Demonstrates progress in stages of grief at own pace (Progressing)     Start:  02/07/24    Expected End:  06/19/24          Goal Note     03/19/24: Reflected on her journey towards acceptance. Identified a desire to engage in conversations about her grandmother. Reports she has noticed a pattern of avoiding conversations around death or loved ones who've passed.  Reflected and challenged self judgement.          STG: Improve involvement of hobbies AEB engaging in painting or crocheting 2/7 days a week for 2 months.  (Initial)     Start:  03/19/24    Expected End:  06/19/24            Therapist will educate patient on cognitive distortions and the rationale for treatment of depression     Start:  03/02/23         Tria will identify 3 cognitive distortions they are currently using and write reframing statements to replace them     Start:  03/02/23         Nhyla will review pleasant activities list and select 2 activities to practice weekly for the next 8 weeks     Start:  03/02/23         Educate patient on: Stages of grief     Start:  02/07/24               ProgressTowards Goals: Progressing  Interventions: Supportive and Other: Grief work  Summary: [name] Law  is a 23y.o. female who presents with symptoms of anxiety and depression including but not limited to uncontrollable worry, anxious feelings, racing thoughts, difficulty concentrating, restlessness, low mood. Pt was oriented times 5. Pt was cooperative and engaged. Pt denies SI/HI/AVH.   The patient reported improvements in prioritizing self-care, particularly through engaging in former hobbies such as painting and crocheting. She also reflected on her progress in setting personal boundaries within certain relationships. Additionally, the patient acknowledged a past tendency to seek validation from others and has since taken steps to improve her self-esteem independently.  The clinician revisited the topic of grief related to the patient's grandparents. The patient shared the efforts she has made to navigate the stages of grief, particularly through painting in honor of her grandmother. She realized that participating in these activities has allowed her to process the emotions associated with her unresolved grief. The clinician and the patient reviewed brief psychoeducation  about the stages of grief and reflected on the patient's experience of unresolved anger towards relatives following her grandfather's passing.  For her work, the patient was encouraged to explore small ways to incorporate her grandmother's relics into her personal space. She was also motivated to continue painting in her grandmother's honor. Finally, the patient was asked to consider writing a letter to express her feelings of anger regarding specific relatives.  Suicidal/Homicidal: Nowithout intent/plan  Therapist Response: Clinician utilized active and supportive reflection to create a safe space for patient to process recent life events. Clinician assessed for current symptoms, stressors, safety since last session.  Provided psychoeducation on the stages of grief and reflected with patient on progress through the stages of grief.  Identified ways in which patient can continue to progress with her grief finding ways to honor her deceased grandparents.  Plan: Return again in 3 weeks.  Diagnosis: Recurrent major depressive disorder, in full remission  Generalized anxiety disorder   Collaboration of Care: AEB psychiatrist can access notes and cln. Will review psychiatrists' notes. Check in with the patient and will see LCSW per availability. Patient agreed with treatment recommendations.   Patient/Guardian was advised Release of Information must be obtained prior to any record release in order to collaborate their care with an outside provider. Patient/Guardian was advised if they have not already done so to contact the registration department to sign all necessary forms in order for us  to release information regarding their care.   Consent: Patient/Guardian gives verbal consent for treatment and assignment of benefits for services provided during this visit. Patient/Guardian expressed understanding and agreed to proceed.   [name], LCSW 04/09/2024

## 2024-04-11 ENCOUNTER — Other Ambulatory Visit: Payer: Self-pay | Admitting: Family Medicine

## 2024-04-11 DIAGNOSIS — Z30011 Encounter for initial prescription of contraceptive pills: Secondary | ICD-10-CM

## 2024-04-12 ENCOUNTER — Ambulatory Visit

## 2024-04-16 NOTE — Telephone Encounter (Signed)
 Requested medication (s) are due for refill today: yes  Requested medication (s) are on the active medication list: yes  Last refill:  01/25/24  Future visit scheduled: yes  Notes to clinic:  Medication not assigned to a protocol, review manually.      Requested Prescriptions  Pending Prescriptions Disp Refills   medroxyPROGESTERone  (DEPO-PROVERA ) 150 MG/ML injection [Pharmacy Med Name: MEDROXYPROGESTERONE  150 MG/ML] 1 mL 0    Sig: INJECT 1 ML (150 MG TOTAL) INTO THE MUSCLE EVERY 3 (THREE) MONTHS     Off-Protocol Failed - 04/16/2024  9:30 AM      Failed - Medication not assigned to a protocol, review manually.      Passed - Valid encounter within last 12 months    Recent Outpatient Visits           1 month ago Sore throat   [facility] Gareth Mliss FALCON, FNP   5 months ago Morbid obesity [facility])   [facility] Sowles, Krichna, MD

## 2024-04-19 ENCOUNTER — Encounter: Admitting: Nurse Practitioner

## 2024-04-19 ENCOUNTER — Ambulatory Visit

## 2024-04-19 ENCOUNTER — Encounter: Payer: Self-pay | Admitting: Family Medicine

## 2024-04-19 ENCOUNTER — Ambulatory Visit: Admitting: Family Medicine

## 2024-04-19 VITALS — BP 122/76 | HR 81 | Resp 16 | Ht 69.0 in | Wt 304.0 lb

## 2024-04-19 DIAGNOSIS — Z0000 Encounter for general adult medical examination without abnormal findings: Secondary | ICD-10-CM | POA: Diagnosis not present

## 2024-04-19 DIAGNOSIS — Z124 Encounter for screening for malignant neoplasm of cervix: Secondary | ICD-10-CM | POA: Diagnosis not present

## 2024-04-19 DIAGNOSIS — E559 Vitamin D deficiency, unspecified: Secondary | ICD-10-CM

## 2024-04-19 DIAGNOSIS — D509 Iron deficiency anemia, unspecified: Secondary | ICD-10-CM

## 2024-04-19 DIAGNOSIS — Z13220 Encounter for screening for lipoid disorders: Secondary | ICD-10-CM

## 2024-04-19 DIAGNOSIS — Z309 Encounter for contraceptive management, unspecified: Secondary | ICD-10-CM

## 2024-04-19 DIAGNOSIS — Z3042 Encounter for surveillance of injectable contraceptive: Secondary | ICD-10-CM | POA: Diagnosis not present

## 2024-04-19 DIAGNOSIS — R7303 Prediabetes: Secondary | ICD-10-CM

## 2024-04-19 DIAGNOSIS — J4540 Moderate persistent asthma, uncomplicated: Secondary | ICD-10-CM

## 2024-04-19 MED ORDER — FLUTICASONE-SALMETEROL 250-50 MCG/ACT IN AEPB: 1.0000 | INHALATION_SPRAY | Freq: Two times a day (BID) | RESPIRATORY_TRACT | 0 refills | Status: AC

## 2024-04-19 MED ORDER — MEDROXYPROGESTERONE ACETATE 150 MG/ML IM SUSP: 150.0000 mg | INTRAMUSCULAR | 0 refills | Status: AC

## 2024-04-19 MED ORDER — MEDROXYPROGESTERONE ACETATE 150 MG/ML IM SUSY
150.0000 mg | PREFILLED_SYRINGE | Freq: Once | INTRAMUSCULAR | Status: AC
  Administered 2024-04-19: 150 mg via INTRAMUSCULAR

## 2024-04-19 NOTE — Progress Notes (Signed)
 "  Complete physical exam  Patient: Michelle Richardson   DOB: July 10, 2000   23 y.o. Female  MRN: [id number]  Subjective:    Chief Complaint  Patient presents with   Annual Exam    Michelle Richardson is a 23 y.o. female who presents today for a complete physical exam. She reports consuming a general diet. The patient has a physically strenuous job, but has no regular exercise apart from work.  She generally feels well. She reports sleeping fairly well. She does not have additional problems to discuss today.   Ms. Michelle Richardson is a new patient to myself, however well established in office. Pleasant 23 year old female who is seen today as noted above for her complete annual physical exam. She has began to improve her diet but with recent holidays this has been difficult, but she plans to follow a heart healthy diet. Strenuous job as noted above, where she works with [employer], however no regular consistent exercise.   She does endorse complaints of intermittent heartburn, however this is infrequent and she reports that typically only occurs with food triggers that she is aware of.    Most recent fall risk assessment:    04/19/2024   10:30 AM  Fall Risk   Falls in the past year? 0  Number falls in past yr: 0  Injury with Fall? 0  Risk for fall due to : No Fall Risks  Follow up Falls prevention discussed     Most recent depression screenings:    04/19/2024   10:30 AM 03/19/2024    9:04 AM  PHQ 2/9 Scores  PHQ - 2 Score 0   PHQ- 9 Score       Information is confidential and restricted. Go to Review Flowsheets to unlock data.      Past Medical History:  Diagnosis Date   Anemia    Anxiety    Asthma    Depression    Pediatric obesity    Suicide ideation 01/11/2019   Past Surgical History:  Procedure Laterality Date   TONSILLECTOMY AND ADENOIDECTOMY  08/02/2008   Social History[1]    Patient Care Team: Sowles, Krichna, MD as PCP - General (Family Medicine)   Show/hide medication  list[2]  Review of Systems  Constitutional:  Negative for malaise/fatigue.  Respiratory:  Negative for shortness of breath and wheezing.   Cardiovascular:  Negative for chest pain and palpitations.  Gastrointestinal:  Positive for heartburn.  Psychiatric/Behavioral:  Negative for depression. The patient is nervous/anxious. The patient does not have insomnia.        Objective:     BP 122/76   Pulse 81   Resp 16   Ht 5' 9 (1.753 m)   Wt (!) 304 lb (137.9 kg)   SpO2 99%   BMI 44.89 kg/m  BP Readings from Last 3 Encounters:  04/19/24 122/76  03/13/24 122/82  11/01/23 126/76   Wt Readings from Last 3 Encounters:  04/19/24 (!) 304 lb (137.9 kg)  03/13/24 297 lb (134.7 kg)  11/01/23 289 lb 8 oz (131.3 kg)      Physical Exam Constitutional:      General: She is not in acute distress.    Appearance: Normal appearance. She is obese.  HENT:     Head: Normocephalic and atraumatic.     Right Ear: Tympanic membrane and ear canal normal.     Left Ear: Tympanic membrane and ear canal normal.     Mouth/Throat:     Pharynx: Oropharynx  is clear. No oropharyngeal exudate or posterior oropharyngeal erythema.  Eyes:     Conjunctiva/sclera: Conjunctivae normal.     Pupils: Pupils are equal, round, and reactive to light.  Cardiovascular:     Rate and Rhythm: Normal rate and regular rhythm.     Heart sounds: Normal heart sounds.  Pulmonary:     Effort: Pulmonary effort is normal. No respiratory distress.     Breath sounds: Normal breath sounds. No wheezing, rhonchi or rales.  Abdominal:     General: Abdomen is flat. Bowel sounds are normal. There is no distension.     Palpations: Abdomen is soft.     Tenderness: There is no abdominal tenderness.  Musculoskeletal:     Cervical back: No tenderness.  Lymphadenopathy:     Cervical: No cervical adenopathy.  Skin:    General: Skin is warm and dry.  Neurological:     General: No focal deficit present.     Mental Status: She is alert  and oriented to person, place, and time.  Psychiatric:        Mood and Affect: Mood normal.        Behavior: Behavior normal.     Last CBC Lab Results  Component Value Date   WBC 7.0 02/15/2023   HGB 11.5 (L) 02/15/2023   HCT 37.9 02/15/2023   MCV 78.5 (L) 02/15/2023   MCH 23.8 (L) 02/15/2023   RDW 14.8 02/15/2023   PLT 356 02/15/2023   Last metabolic panel Lab Results  Component Value Date   GLUCOSE 78 02/15/2023   NA 142 02/15/2023   K 4.7 02/15/2023   CL 107 02/15/2023   CO2 27 02/15/2023   BUN 12 02/15/2023   CREATININE 0.86 02/15/2023   EGFR 99 02/15/2023   CALCIUM 9.9 02/15/2023   PROT 7.0 02/15/2023   ALBUMIN 3.9 01/11/2019   ALBUMIN 3.7 01/11/2019   BILITOT 0.3 02/15/2023   ALKPHOS 91 01/11/2019   ALKPHOS 94 01/11/2019   AST 11 02/15/2023   ALT 7 02/15/2023   ANIONGAP 11 01/11/2019   Last lipids Lab Results  Component Value Date   CHOL 144 02/15/2023   HDL 60 02/15/2023   LDLCALC 69 02/15/2023   TRIG 74 02/15/2023   CHOLHDL 2.4 02/15/2023   Last hemoglobin A1c Lab Results  Component Value Date   HGBA1C 6.0 (H) 02/15/2023   Last thyroid functions Lab Results  Component Value Date   TSH 0.73 01/06/2022   Last vitamin D  Lab Results  Component Value Date   VD25OH 14 (L) 02/15/2023   Last vitamin B12 and Folate Lab Results  Component Value Date   VITAMINB12 417 02/15/2023   FOLATE 6.2 02/15/2023        Assessment & Plan:    Routine Health Maintenance and Physical Exam  Immunization History  Administered Date(s) Administered   DTaP 04/07/2003, 06/09/2003, 08/20/2003, 09/23/2004, 08/15/2008   HIB (PRP-OMP) 04/07/2003, 06/09/2003, 08/20/2003, 09/23/2004   HIB (PRP-T) 04/07/2003, 06/09/2003, 08/20/2003   HPV 9-valent 07/17/2014, 05/16/2016   Hepatitis A 10/30/2009, 07/17/2014   Hepatitis A, Ped/Adol-2 Dose 10/30/2009, 07/17/2014   Hepatitis B 03/22/03, 04/07/2003, 08/20/2003   IPV 04/07/2003, 06/09/2003, 02/17/2004, 08/15/2008    Influenza, Seasonal, Injecte, Preservative Fre 02/17/2004, 02/15/2023   Influenza,inj,Quad PF,6+ Mos 01/05/2022   Influenza-Unspecified 04/18/2004   MMR 02/17/2004, 08/15/2008   Meningococcal B, Unspecified 01/13/2020   Meningococcal Conjugate 07/17/2014, 01/13/2020   PFIZER(Purple Top)SARS-COV-2 Vaccination 10/17/2019, 10/22/2019, 11/18/2019   PNEUMOCOCCAL CONJUGATE-20 02/15/2023   Pneumococcal Conjugate PCV 7 04/07/2003,  06/09/2003, 08/20/2003, 02/17/2004   Pneumococcal Conjugate-13 04/07/2003, 06/09/2003, 08/20/2003, 02/17/2004   Td 07/17/2014   Tdap 07/17/2014   Varicella 02/17/2004, 08/15/2008    Health Maintenance  Topic Date Due   Cervical Cancer Screening (Pap smear)  Never done   COVID-19 Vaccine (4 - 2025-26 season) 05/05/2024 (Originally 12/25/2023)   Influenza Vaccine  07/23/2024 (Originally 11/24/2023)   Meningococcal B Vaccine (1 of 2 - Standard) 04/19/2025 (Originally 02/10/2020)   CHLAMYDIA SCREENING  05/23/2024   DTaP/Tdap/Td (8 - Td or Tdap) 07/16/2024   Pneumococcal Vaccine  Completed   Hepatitis B Vaccines 19-59 Average Risk  Completed   HPV VACCINES  Completed   Hepatitis C Screening  Completed   HIV Screening  Completed    Discussed health benefits of physical activity, and encouraged her to engage in regular exercise appropriate for her age and condition.  Assessment & Plan Well adult exam Patient is a pleasant 23 year old female who is seen in office today for her annual physical exam. As noted, she is new patient to myself but established in office. She has no true concerns or other complaints to be voiced at today's visit.   -Routine labs completed -Health maintenance addressed which includes pap screening. She declines pap at today's visit. Discussed importance of pap screening as method of cervical cancer screening. She states she was not prepared today but is agreeable to schedule with PCP for follow up and pap. Will plan for 3 month f/u for pap. She is  agreeable. -Discussed importance of regular exercise and heart healthy diet, which both would promote weight loss. Discussed that exercise and well balanced diet are both important for mental health. Encouraged routine cardiovascular exercise such as walking, light weightlifting and other bodyweight exercise such as pilates.  -STD screening offered and declined by patient Orders:   CBC w/Diff/Platelet   Comprehensive Metabolic Panel (CMET)   Lipid Profile  Screening for cervical cancer Pap declined by patient at today's visit but is agreeable to follow through with pap with PCP at next scheduled visit. Plan for 3 month f/u as noted.   -Pap offered with physical and declined by patient. -Pap not UTD    Asthma, moderate persistent, well-controlled Confirmed asthma diagnosis. She reports she does not consistently require use of rescue inhaler, voicing rescue inhaler use is infrequent. Albuterol use only required typically with acute sickness/illnesses or during increased/ strenuous physical activity. She reports compliance with maintenance inhaler, Wixela, allowing adequate control of asthma symptoms. She does report she needs Wixela inhaler refilled.   -Continue Wixela as prescribed, medication refill provided Orders:   CBC w/Diff/Platelet   fluticasone -salmeterol (WIXELA INHUB) 250-50 MCG/ACT AEPB; Inhale 1 puff into the lungs 2 (two) times daily. in the morning and at bedtime.  Iron deficiency anemia, unspecified iron deficiency anemia type Iron deficiency anemia diagnosis, and she admits to inconsistent oral iron supplementation. Iron deficiency anemia previously uncontrolled. Last available labs showing iron saturation of 7 and ferritin of 8 in 01/2023. Will note she endorses previously menstrual cycles were regular with heavy vaginal bleeding, thought to be source of iron deficiency. Since starting Depo injection for contraception management and previous complaints of heavy vaginal  bleeding, menstrual periods are irregular and typically vaginal bleeding is light describing spotting.   -Update labs including iron profile Orders:   CBC w/Diff/Platelet   Fe+TIBC+Fer  Prediabetes Reviewed labs consistent with prediabetes diagnosis. Last A1c of 6 in 01/2023.  -Encouraged lifestyle modifications including improved diet limiting starchy carbohydrates, increased  exercise and weight loss. -Update A1c with today's labs -3 month f/u with PCP Orders:   HgB A1c  Vitamin D  deficiency Confirmed vitamin D  deficiency diagnosis and patient reports inconsistent vitamin D  supplementation. Vitamin D  deficiency previously uncontrolled with last vitamin D  level of 14 in 01/2023.  -Update vitamin D  level with today's labs Orders:   Vitamin D  (25 hydroxy)  Morbid obesity (HCC) BMI > 40 with comorbidity including prediabetes.   -Encouraged lifestyle modifications: dietary changes, increase in physical activity/exercise, and weight loss -Discussed importance of adequate sleep to promote weight loss  Orders:   Comprehensive Metabolic Panel (CMET)   TSH  Encounter for contraceptive management, unspecified type She requests a refill on Depo-Provera . Last Dep-Provera  injection on 01/26/24.  She is sexually active with one partner, and reports inconsistent use of condoms.   -Discussed importance of Depo-Provera  injections every 3 months as directed to be most effective for contraception/pregnancy prevention -Advised use of back up method/ barrier method for pregnancy prevention and STD prevention -LMP 6 months ago and reports this has been her normal since starting Depo injections. Previously menstrual periods were once monthly prior to contraception use.  -Offered referral to GYN to discuss other methods for contraception which she declines at this time   Orders:   medroxyPROGESTERone  (DEPO-PROVERA ) 150 MG/ML injection; Inject 1 mL (150 mg total) into the muscle every 3 (three)  months.  Lipid screening Lipid screening with today's visit/ physical exam, noting diagnoses including prediabetes and morbid obesity (BMI >40) Orders:   Lipid Profile     Return in about 3 months (around 07/18/2024) for pap with PCP.     Hans Rusher N Draxton Luu, FNP      [1]  Social History Tobacco Use   Smoking status: Never   Smokeless tobacco: Never  Vaping Use   Vaping status: Never Used  Substance Use Topics   Alcohol use: Never   Drug use: Never  [2]  Outpatient Medications Prior to Visit  Medication Sig   busPIRone  (BUSPAR ) 5 MG tablet Take 1 tablet (5 mg total) by mouth 2 (two) times daily.   Cholecalciferol (VITAMIN D ) 50 MCG (2000 UT) CAPS Take 1 capsule by mouth daily.   DULoxetine  (CYMBALTA ) 20 MG capsule Take 1 capsule (20 mg total) by mouth daily. Take along with 60 mg daily - total of 80 mg daily   DULoxetine  (CYMBALTA ) 60 MG capsule Take 1 capsule (60 mg total) by mouth daily. Take along with 20 mg daily   fluticasone -salmeterol (WIXELA INHUB) 250-50 MCG/ACT AEPB INHALE 1 PUFF INTO THE LUNGS IN THE MORNING AND AT BEDTIME.   medroxyPROGESTERone  (DEPO-PROVERA ) 150 MG/ML injection INJECT 1 ML (150 MG TOTAL) INTO THE MUSCLE EVERY 3 (THREE) MONTHS   prazosin  (MINIPRESS ) 1 MG capsule TAKE 1 CAPSULE BY MOUTH EVERYDAY AT BEDTIME   propranolol  (INDERAL ) 10 MG tablet Take 1 tablet (10 mg total) by mouth 2 (two) times daily as needed.   [DISCONTINUED] benzonatate  (TESSALON ) 100 MG capsule Take 1 capsule (100 mg total) by mouth 2 (two) times daily as needed for cough.   [DISCONTINUED] predniSONE  (STERAPRED UNI-PAK 21 TAB) 10 MG (21) TBPK tablet Use as directed.   Facility-Administered Medications Prior to Visit  Medication Dose Route Frequency Provider   medroxyPROGESTERone  Acetate SUSY 150 mg  150 mg Intramuscular Q90 days Bernardo Fend, DO   "

## 2024-04-19 NOTE — Assessment & Plan Note (Signed)
 Confirmed vitamin D  deficiency diagnosis and patient reports inconsistent vitamin D  supplementation. Vitamin D  deficiency previously uncontrolled with last vitamin D  level of 14 in 01/2023.  -Update vitamin D  level with today's labs Orders:   Vitamin D  (25 hydroxy)

## 2024-04-19 NOTE — Assessment & Plan Note (Signed)
 BMI > 40 with comorbidity including prediabetes.   -Encouraged lifestyle modifications: dietary changes, increase in physical activity/exercise, and weight loss -Discussed importance of adequate sleep to promote weight loss  Orders:   Comprehensive Metabolic Panel (CMET)   TSH

## 2024-04-19 NOTE — Assessment & Plan Note (Signed)
 Reviewed labs consistent with prediabetes diagnosis. Last A1c of 6 in 01/2023.  -Encouraged lifestyle modifications including improved diet limiting starchy carbohydrates, increased exercise and weight loss. -Update A1c with today's labs -3 month f/u with PCP Orders:   HgB A1c

## 2024-04-20 LAB — COMPREHENSIVE METABOLIC PANEL WITH GFR
AG Ratio: 1.6 (calc) (ref 1.0–2.5)
ALT: 9 U/L (ref 6–29)
AST: 13 U/L (ref 10–30)
Albumin: 4.2 g/dL (ref 3.6–5.1)
Alkaline phosphatase (APISO): 93 U/L (ref 31–125)
BUN: 10 mg/dL (ref 7–25)
CO2: 25 mmol/L (ref 20–32)
Calcium: 9.3 mg/dL (ref 8.6–10.2)
Chloride: 107 mmol/L (ref 98–110)
Creat: 0.82 mg/dL (ref 0.50–0.96)
Globulin: 2.6 g/dL (ref 1.9–3.7)
Glucose, Bld: 99 mg/dL (ref 65–99)
Potassium: 4.3 mmol/L (ref 3.5–5.3)
Sodium: 140 mmol/L (ref 135–146)
Total Bilirubin: 0.5 mg/dL (ref 0.2–1.2)
Total Protein: 6.8 g/dL (ref 6.1–8.1)
eGFR: 104 mL/min/1.73m2

## 2024-04-20 LAB — CBC WITH DIFFERENTIAL/PLATELET
Absolute Lymphocytes: 2040 {cells}/uL (ref 850–3900)
Absolute Monocytes: 371 {cells}/uL (ref 200–950)
Basophils Absolute: 28 {cells}/uL (ref 0–200)
Basophils Relative: 0.6 %
Eosinophils Absolute: 249 {cells}/uL (ref 15–500)
Eosinophils Relative: 5.3 %
HCT: 42.9 % (ref 35.9–46.0)
Hemoglobin: 13.3 g/dL (ref 11.7–15.5)
MCH: 24.9 pg — ABNORMAL LOW (ref 27.0–33.0)
MCHC: 31 g/dL — ABNORMAL LOW (ref 31.6–35.4)
MCV: 80.3 fL — ABNORMAL LOW (ref 81.4–101.7)
MPV: 11.8 fL (ref 7.5–12.5)
Monocytes Relative: 7.9 %
Neutro Abs: 2012 {cells}/uL (ref 1500–7800)
Neutrophils Relative %: 42.8 %
Platelets: 264 Thousand/uL (ref 140–400)
RBC: 5.34 Million/uL — ABNORMAL HIGH (ref 3.80–5.10)
RDW: 14.3 % (ref 11.0–15.0)
Total Lymphocyte: 43.4 %
WBC: 4.7 Thousand/uL (ref 3.8–10.8)

## 2024-04-20 LAB — TSH: TSH: 1.02 m[IU]/L

## 2024-04-20 LAB — HEMOGLOBIN A1C
Hgb A1c MFr Bld: 5.6 %
Mean Plasma Glucose: 114 mg/dL
eAG (mmol/L): 6.3 mmol/L

## 2024-04-20 LAB — IRON,TIBC AND FERRITIN PANEL
%SAT: 10 % — ABNORMAL LOW (ref 16–45)
Ferritin: 13 ng/mL — ABNORMAL LOW (ref 16–154)
Iron: 35 ug/dL — ABNORMAL LOW (ref 40–190)
TIBC: 355 ug/dL (ref 250–450)

## 2024-04-20 LAB — LIPID PANEL
Cholesterol: 129 mg/dL
HDL: 57 mg/dL
LDL Cholesterol (Calc): 59 mg/dL
Non-HDL Cholesterol (Calc): 72 mg/dL
Total CHOL/HDL Ratio: 2.3 (calc)
Triglycerides: 57 mg/dL

## 2024-04-20 LAB — VITAMIN D 25 HYDROXY (VIT D DEFICIENCY, FRACTURES): Vit D, 25-Hydroxy: 16 ng/mL — ABNORMAL LOW (ref 30–100)

## 2024-04-22 ENCOUNTER — Ambulatory Visit: Payer: Self-pay | Admitting: Family Medicine

## 2024-04-22 DIAGNOSIS — E559 Vitamin D deficiency, unspecified: Secondary | ICD-10-CM

## 2024-04-22 MED ORDER — VITAMIN D (ERGOCALCIFEROL) 1.25 MG (50000 UNIT) PO CAPS: 50000.0000 [IU] | ORAL_CAPSULE | ORAL | 0 refills | Status: AC

## 2024-04-30 ENCOUNTER — Ambulatory Visit (INDEPENDENT_AMBULATORY_CARE_PROVIDER_SITE_OTHER): Admitting: Licensed Clinical Social Worker

## 2024-04-30 DIAGNOSIS — F411 Generalized anxiety disorder: Secondary | ICD-10-CM | POA: Diagnosis not present

## 2024-04-30 DIAGNOSIS — F3342 Major depressive disorder, recurrent, in full remission: Secondary | ICD-10-CM | POA: Diagnosis not present

## 2024-04-30 NOTE — Progress Notes (Signed)
 "  THERAPIST PROGRESS NOTE  Session Time: 9:05-10am  Participation Level: Active  Behavioral Response: CasualAlertAnxious  Type of Therapy: Individual Therapy  Treatment Goals addressed:  Active     Anxiety     LTG: Kacy will score less than 5 on the Generalized Anxiety Disorder 7 Scale (GAD-7)  (Completed/Met)     Start:  03/02/23    Expected End:  03/27/24    Resolved:  03/19/24      STG: Gudrun will practice problem solving skills 3 times per week for the next 4 weeks.  (Completed/Met)     Start:  03/02/23    Expected End:  06/19/24    Resolved:  03/19/24      Work with patient individually to identify the major components of a recent episode of anxiety: physical symptoms, major thoughts and images, and major behaviors they experienced     Start:  03/02/23         Perform motivational interviewing regarding use of tools     Start:  03/02/23           OP Depression     LTG: Reduce frequency, intensity, and duration of depression symptoms so that daily functioning is improved (Progressing)     Start:  03/02/23    Expected End:  06/19/24         STG: Elijah will identify cognitive patterns and beliefs that support depression (Progressing)     Start:  03/02/23    Expected End:  06/19/24       Goal Note     03/19/24: patient reflected on progress with her acceptance of letting go of control.          STG: Jillyan will practice behavioral activation skills 2 times per week for the next 8 weeks (Completed/Met)     Start:  03/02/23    Expected End:  03/27/24    Resolved:  02/07/24      Demonstrates progress in stages of grief at own pace (Progressing)     Start:  02/07/24    Expected End:  06/19/24          Goal Note     03/19/24: Reflected on her journey towards acceptance. Identified a desire to engage in conversations about her grandmother. Reports she has noticed a pattern of avoiding conversations around death or loved ones who've passed. Reflected and  challenged self judgement.          STG: Improve involvement of hobbies AEB engaging in painting or crocheting 2/7 days a week for 2 months.  (Initial)     Start:  03/19/24    Expected End:  06/19/24            Therapist will educate patient on cognitive distortions and the rationale for treatment of depression     Start:  03/02/23         Anida will identify 3 cognitive distortions they are currently using and write reframing statements to replace them     Start:  03/02/23         Michaella will review pleasant activities list and select 2 activities to practice weekly for the next 8 weeks     Start:  03/02/23         Educate patient on: Stages of grief     Start:  02/07/24               ProgressTowards Goals: Progressing  Interventions: Solution Focused, Assertiveness Training, and Supportive  Summary:  Michelle Richardson is a 24y.o. female who presents with symptoms of anxiety and depression including but not limited to uncontrollable worry, anxious feelings, racing thoughts, difficulty concentrating, restlessness, low mood. Pt was oriented times 5. Pt was cooperative and engaged. Pt denies SI/HI/AVH.   The patient utilized the therapeutic space to reflect on recent stressors related to her personal relationships. She identified specific individuals in her life whom she feels do not respect her boundaries, which leads to feelings of discomfort. The clinician and the patient worked together to establish ways for her to use assertive communication to reinforce her boundaries. We also discussed the importance of external support if she deems it necessary. Additionally, the clinician and patient explored her readiness to reevaluate certain friendships that demonstrate a disregard for her personal boundaries and have firm conversations related to the level of relationship moving forward.  The patient expressed a goal of moving out of her mother's home and starting school. She mentioned  that she has begun taking steps to look for new job opportunities. However, she experienced anxiety and feelings of being overwhelmed due to her concerns about the validation of others in relation to these next life steps. The clinician collaborated with the patient to challenge this concern, focusing instead on what she needs to do for her future. We explored specific action steps that the patient could take to address her anxious thoughts and identify facts about her situation, such as reaching out to the financial aid office of her program to discuss potential financial support for her education. The patient determined the details about how she feels she can accomplish this and worked with the clinician to overcome any barriers.  Suicidal/Homicidal Risk: None, without intent or plan.  Therapist Response: The clinician engaged in active and supportive reflection to create a safe space for the patient to process recent life events. The clinician assessed the patient for current symptoms, stressors, and safety since her last session. The patient role-played conversations she can have with specific individuals in an effort to end unhealthy relationships. The clinician assisted the patient in establishing action steps to encourage her pursuit of further education, with the patient identifying her first step as inquiring about financial aid.  Plan: Return again in 3 weeks.  Diagnosis: Recurrent major depressive disorder, in full remission  Generalized anxiety disorder   Collaboration of Care:  [facility] psychiatrist can access notes and cln. Will review psychiatrists' notes. Check in with the patient and will see LCSW per availability. Patient agreed with treatment recommendations.   Patient/Guardian was advised Release of Information must be obtained prior to any record release in order to collaborate their care with an outside provider. Patient/Guardian was advised if they have not already done so to contact  the registration department to sign all necessary forms in order for us  to release information regarding their care.   Consent: Patient/Guardian gives verbal consent for treatment and assignment of benefits for services provided during this visit. Patient/Guardian expressed understanding and agreed to proceed.   [name], LCSW 04/30/2024  "

## 2024-05-03 ENCOUNTER — Encounter: Admitting: Family Medicine

## 2024-05-22 NOTE — Progress Notes (Unsigned)
 "  THERAPIST PROGRESS NOTE  Session Time: 9:02-9:56am  Participation Level: Active  Behavioral Response: CasualAlertEuthymic  Type of Therapy: Individual Therapy  Treatment Goals addressed:  Active     Anxiety     LTG: Michelle Richardson will score less than 5 on the Generalized Anxiety Disorder 7 Scale (GAD-7)  (Completed/Met)     Start:  03/02/23    Expected End:  03/27/24    Resolved:  03/19/24      STG: Michelle Richardson will practice problem solving skills 3 times per week for the next 4 weeks.  (Completed/Met)     Start:  03/02/23    Expected End:  06/19/24    Resolved:  03/19/24      Work with patient individually to identify the major components of a recent episode of anxiety: physical symptoms, major thoughts and images, and major behaviors they experienced     Start:  03/02/23         Perform motivational interviewing regarding use of tools     Start:  03/02/23           OP Depression     LTG: Reduce frequency, intensity, and duration of depression symptoms so that daily functioning is improved (Progressing)     Start:  03/02/23    Expected End:  06/19/24         STG: Michelle Richardson will identify cognitive patterns and beliefs that support depression (Progressing)     Start:  03/02/23    Expected End:  06/19/24       Goal Note     03/19/24: patient reflected on progress with her acceptance of letting go of control.          STG: Michelle Richardson will practice behavioral activation skills 2 times per week for the next 8 weeks (Completed/Met)     Start:  03/02/23    Expected End:  03/27/24    Resolved:  02/07/24      Demonstrates progress in stages of grief at own pace (Progressing)     Start:  02/07/24    Expected End:  06/19/24          Goal Note     03/19/24: Reflected on her journey towards acceptance. Identified a desire to engage in conversations about her grandmother. Reports she has noticed a pattern of avoiding conversations around death or loved ones who've passed. Reflected  and challenged self judgement.          STG: Improve involvement of hobbies AEB engaging in painting or crocheting 2/7 days a week for 2 months.  (Initial)     Start:  03/19/24    Expected End:  06/19/24            Therapist will educate patient on cognitive distortions and the rationale for treatment of depression     Start:  03/02/23         Michelle Richardson will identify 3 cognitive distortions they are currently using and write reframing statements to replace them     Start:  03/02/23         Michelle Richardson will review pleasant activities list and select 2 activities to practice weekly for the next 8 weeks     Start:  03/02/23         Educate patient on: Stages of grief     Start:  02/07/24               ProgressTowards Goals: Progressing  Interventions: Solution Focused, Assertiveness Training, and Supportive  Summary: Michelle Richardson is a 24y.o. female who presents with symptoms of anxiety and depression including but not limited to uncontrollable worry, anxious feelings, racing thoughts, difficulty concentrating, restlessness, low mood. Pt was oriented times 5. Pt was cooperative and engaged. Pt denies SI/HI/AVH.    The patient shared her growing concerns about her family's financial security. She considered the impact of being transparent with her mother compared to the information she chooses to share with her siblings. We engaged in a discussion about personal boundaries, exploring ways she could encourage her mother to establish her own boundaries, which could help create opportunities for seeking appropriate financial support. The clinician also provided the patient with various resources for utility support for her family.  The patient expressed excitement about registering for online classes this fall to pursue her associate's degree. However, she also mentioned, I do not like how my life is going, and conveyed a sense of regret over previous life choices. She reflected on how  school offers her a sense of peace and control, especially since she often experiences anxiety due to feeling that much of her life is out of control. She is grieving the structure she once had, particularly in contrast to her current chaotic situation. Additionally, the patient expressed uncertainty about her ability to challenge self-deprecating thoughts.   Suicidal/Homicidal Risk: None, without intent or plan.  Therapist Response: The clinician engaged in active and supportive reflection to create a safe space for the patient to process recent life events. The clinician assessed the patient for current symptoms, stressors, and safety since her last session. Clinician worked with patient through CBT identifying through the magical question how school would be beneficial for obtaining personal goals.  Worked with patient to identify ways in which she can cope with internal dialogue that questions worst-case scenarios as well as doubts her capabilities to cope with new situations.   Plan: Return again in 3 weeks.   Diagnosis: Recurrent major depressive disorder, in full remission   Generalized anxiety disorder     Collaboration of Care:  [facility] psychiatrist can access notes and cln. Will review psychiatrists' notes. Check in with the patient and will see LCSW per availability. Patient agreed with treatment recommendations.   Patient/Guardian was advised Release of Information must be obtained prior to any record release in order to collaborate their care with an outside provider. Patient/Guardian was advised if they have not already done so to contact the registration department to sign all necessary forms in order for us  to release information regarding their care.   Consent: Patient/Guardian gives verbal consent for treatment and assignment of benefits for services provided during this visit. Patient/Guardian expressed understanding and agreed to proceed.   [name], LCSW 05/22/2024  "

## 2024-05-23 ENCOUNTER — Ambulatory Visit: Admitting: Licensed Clinical Social Worker

## 2024-05-23 DIAGNOSIS — F411 Generalized anxiety disorder: Secondary | ICD-10-CM | POA: Diagnosis not present

## 2024-05-23 DIAGNOSIS — F3342 Major depressive disorder, recurrent, in full remission: Secondary | ICD-10-CM | POA: Diagnosis not present

## 2024-06-13 ENCOUNTER — Ambulatory Visit: Admitting: Licensed Clinical Social Worker

## 2024-07-04 ENCOUNTER — Ambulatory Visit: Admitting: Licensed Clinical Social Worker

## 2024-07-18 ENCOUNTER — Ambulatory Visit: Admitting: Family Medicine

## 2024-08-08 ENCOUNTER — Ambulatory Visit: Admitting: Licensed Clinical Social Worker
